# Patient Record
Sex: Male | Born: 1980 | Race: White | Hispanic: No | Marital: Single | Smoking: Current every day smoker
Health system: Southern US, Community
[De-identification: ages and names within clinical notes are randomized; demographics above are authoritative.]

## PROBLEM LIST (undated history)

## (undated) DIAGNOSIS — F329 Major depressive disorder, single episode, unspecified: Secondary | ICD-10-CM

## (undated) DIAGNOSIS — F1910 Other psychoactive substance abuse, uncomplicated: Secondary | ICD-10-CM

## (undated) DIAGNOSIS — F32A Depression, unspecified: Secondary | ICD-10-CM

## (undated) HISTORY — PX: APPENDECTOMY: SHX54

---
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
## 2005-06-22 ENCOUNTER — Emergency Department (HOSPITAL_COMMUNITY): Admission: EM | Admit: 2005-06-22 | Discharge: 2005-06-22 | Payer: Self-pay | Admitting: Family Medicine

## 2006-01-26 ENCOUNTER — Emergency Department (HOSPITAL_COMMUNITY): Admission: EM | Admit: 2006-01-26 | Discharge: 2006-01-26 | Payer: Self-pay | Admitting: *Deleted

## 2007-01-01 ENCOUNTER — Emergency Department (HOSPITAL_COMMUNITY): Admission: EM | Admit: 2007-01-01 | Discharge: 2007-01-01 | Payer: Self-pay | Admitting: Family Medicine

## 2007-04-21 ENCOUNTER — Emergency Department (HOSPITAL_COMMUNITY): Admission: EM | Admit: 2007-04-21 | Discharge: 2007-04-21 | Payer: Self-pay | Admitting: Emergency Medicine

## 2007-05-04 ENCOUNTER — Ambulatory Visit: Payer: Self-pay | Admitting: Internal Medicine

## 2007-05-05 ENCOUNTER — Ambulatory Visit (HOSPITAL_COMMUNITY): Admission: RE | Admit: 2007-05-05 | Discharge: 2007-05-05 | Payer: Self-pay | Admitting: Internal Medicine

## 2007-06-16 ENCOUNTER — Ambulatory Visit (HOSPITAL_BASED_OUTPATIENT_CLINIC_OR_DEPARTMENT_OTHER): Admission: RE | Admit: 2007-06-16 | Discharge: 2007-06-16 | Payer: Self-pay | Admitting: General Surgery

## 2007-06-16 ENCOUNTER — Encounter (INDEPENDENT_AMBULATORY_CARE_PROVIDER_SITE_OTHER): Payer: Self-pay | Admitting: General Surgery

## 2007-10-04 ENCOUNTER — Emergency Department (HOSPITAL_COMMUNITY): Admission: EM | Admit: 2007-10-04 | Discharge: 2007-10-04 | Payer: Self-pay | Admitting: Family Medicine

## 2009-03-11 ENCOUNTER — Emergency Department (HOSPITAL_COMMUNITY): Admission: EM | Admit: 2009-03-11 | Discharge: 2009-03-11 | Payer: Self-pay | Admitting: Emergency Medicine

## 2009-04-03 IMAGING — CT CT PELVIS W/ CM
3 series · 17 of 46 positions shown, 20 images · IV contrast (omnipaque)
Comparison: none

CLINICAL DATA: Pain and palpable abnormality in the left scrotal and inguinal regions.
 PELVIS CT WITH CONTRAST:
TECHNIQUE: Multidetector CT imaging of the pelvis was performed following the standard protocol during bolus administration of intravenous contrast.
 Contrast:  100 cc Omnipaque 300

[Series 2: rtn pelvis 5.0 b40f · axial · 0.70mm/px · z∈[-322,-68]mm · 13 of 57 slices shown, 16 images]
[im 4/57  soft-tissue]
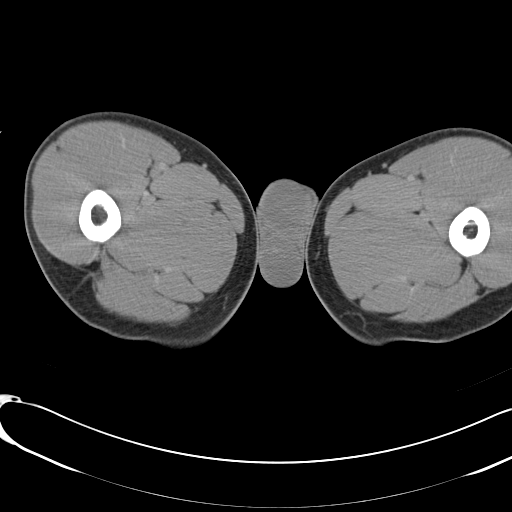
[im 4/57  bone]
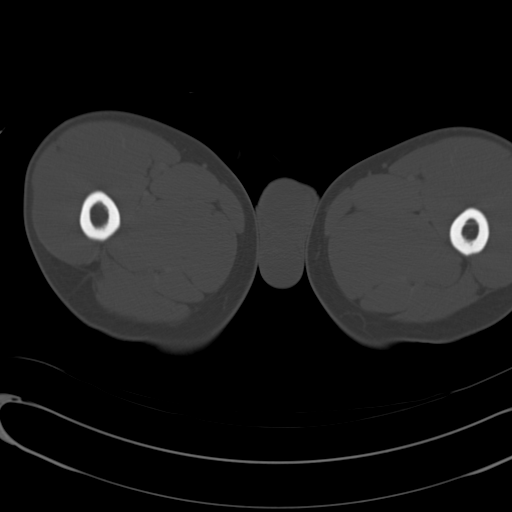
[im 10/57  soft-tissue]
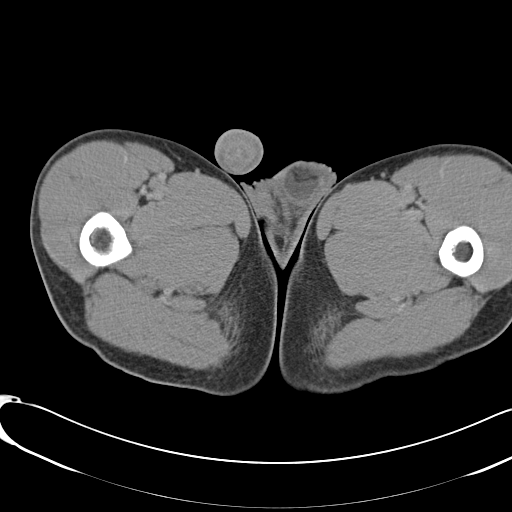
[im 15/57  soft-tissue]
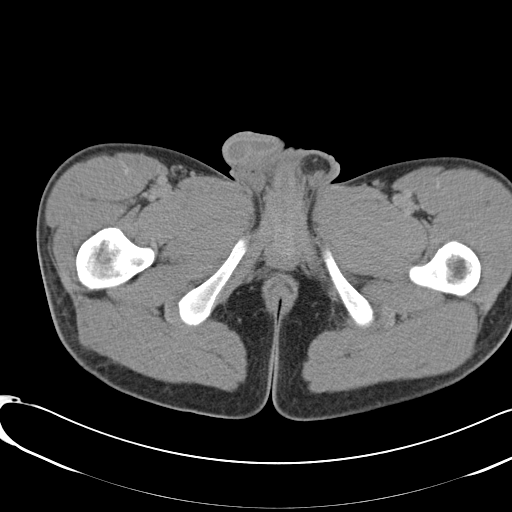
[im 20/57  soft-tissue]
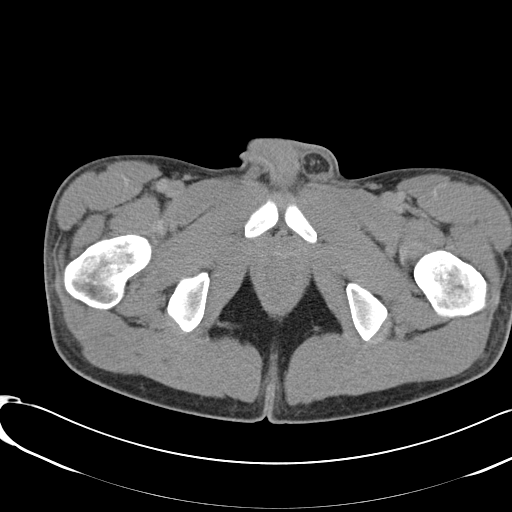
[im 26/57  soft-tissue]
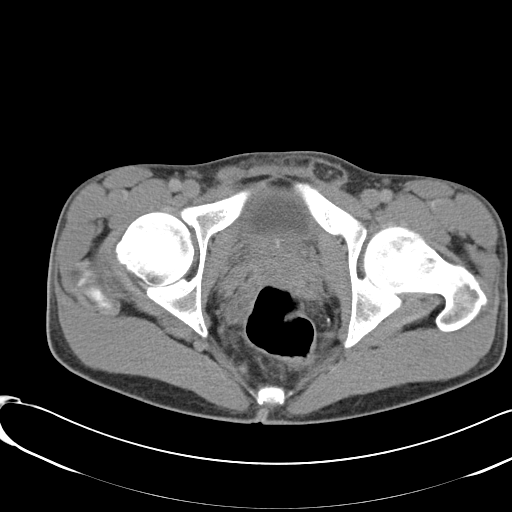
[im 31/57  soft-tissue]
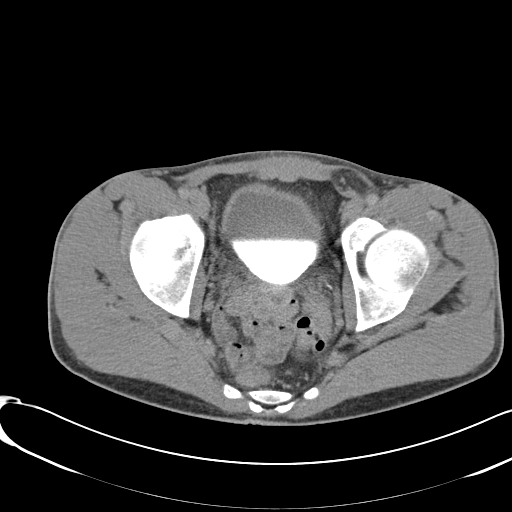
[im 37/57  soft-tissue]
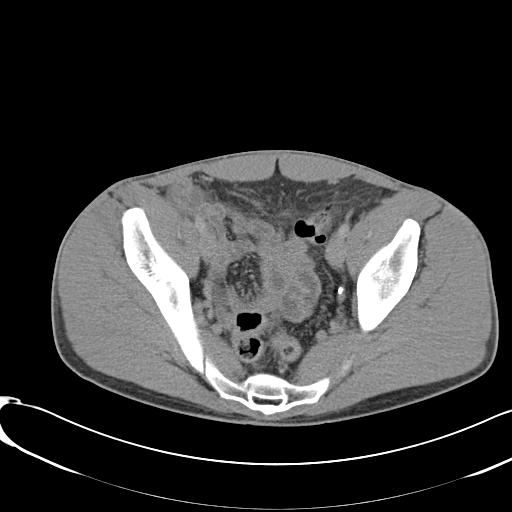
[im 42/57  soft-tissue]
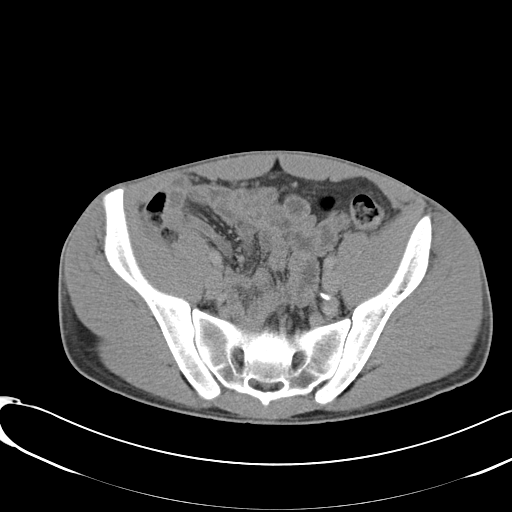
[im 47/57  soft-tissue]
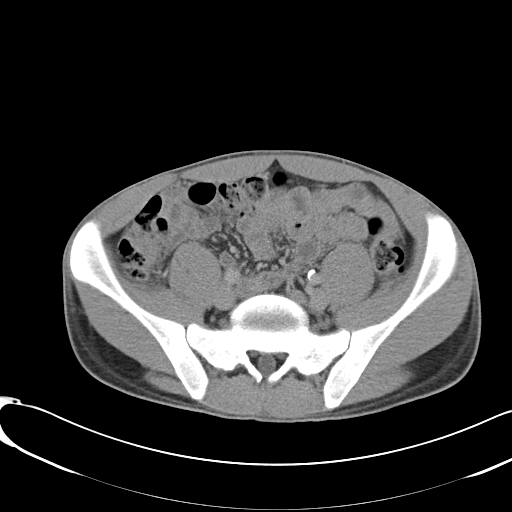
[im 47/57  bone]
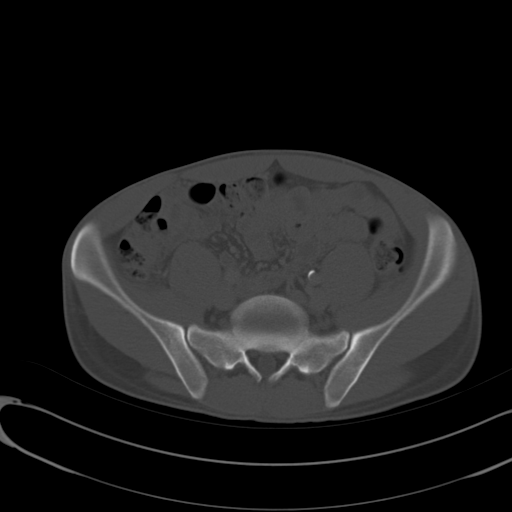
[im 49/57  lung]
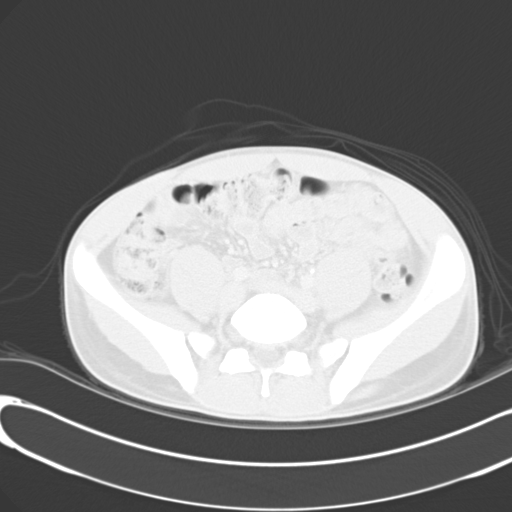
[im 51/57  lung]
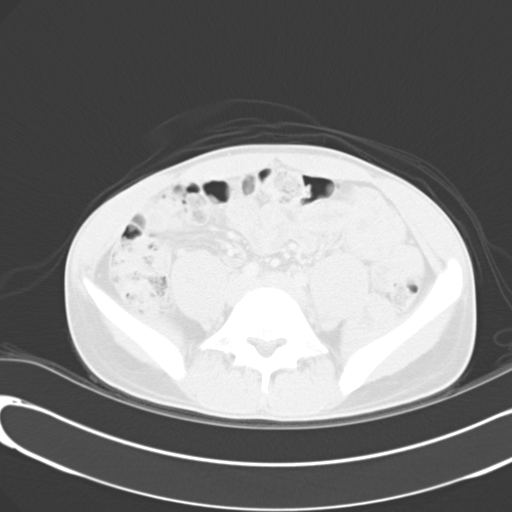
[im 53/57  soft-tissue]
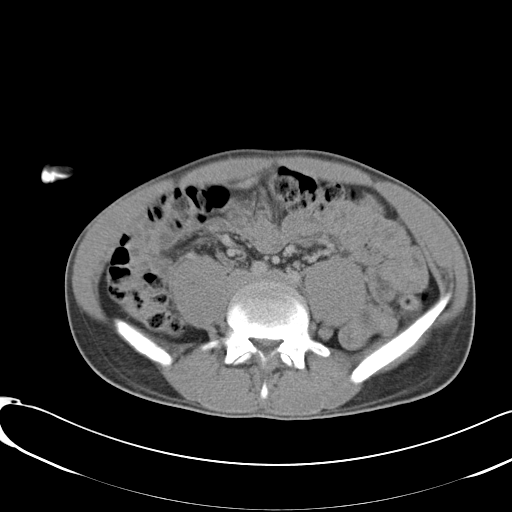
[im 53/57  lung]
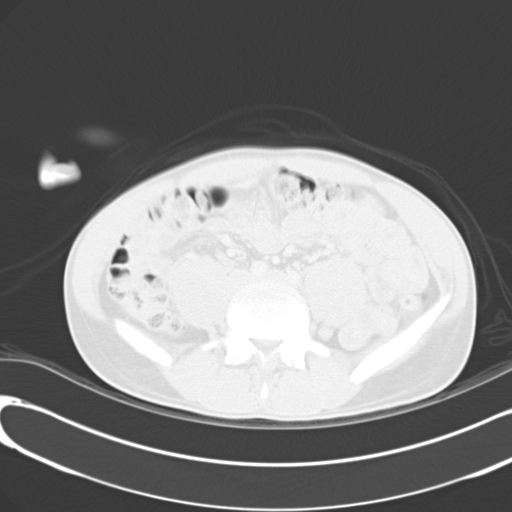
[im 55/57  lung]
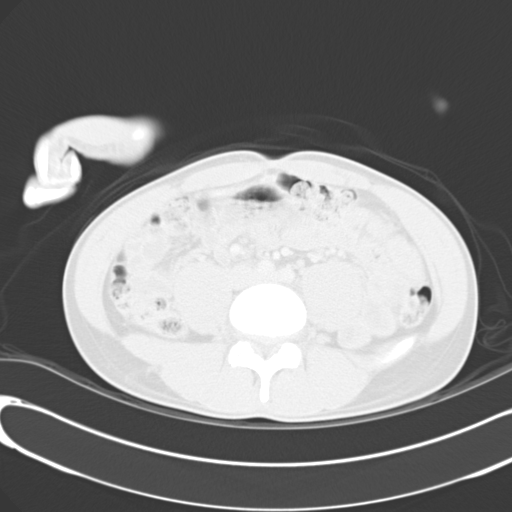

[Series 603: <mpr thick range> · coronal · 0.70mm/px · 3 of 67 slices shown]
[im 23/67  soft-tissue]
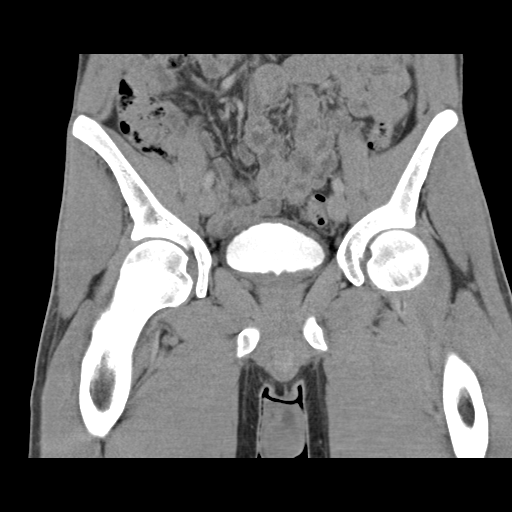
[im 30/67  soft-tissue]
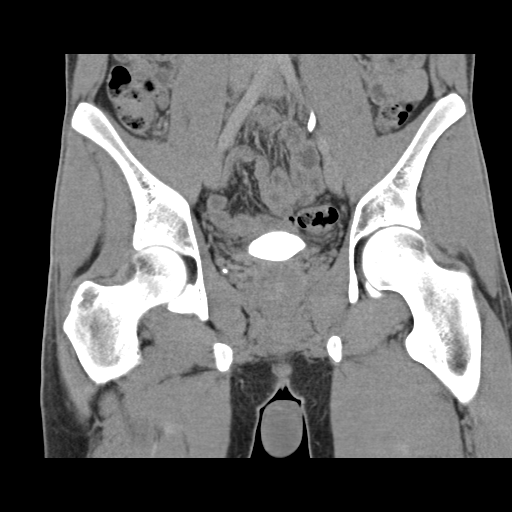
[im 37/67  soft-tissue]
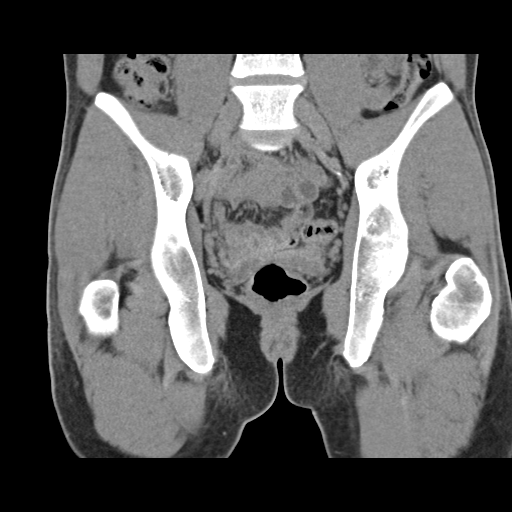

[Series 605: <mpr thick range(1)> · sagittal · 0.70mm/px · 1 of 104 slices shown]
[im 35/104  soft-tissue]
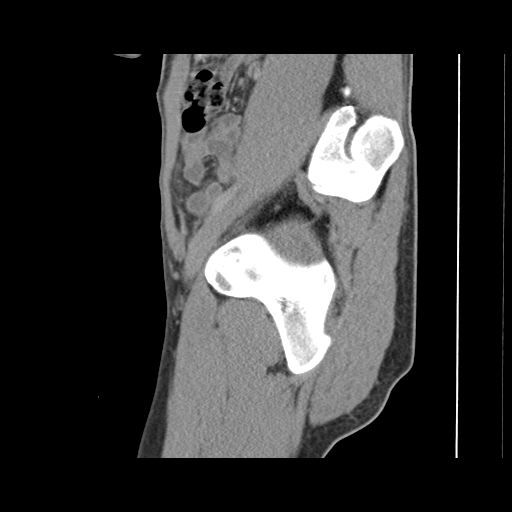

[17 of 46 positions shown; findings below may reference images not displayed]

FINDINGS: A fat attenuation structure is seen in the left inguinal canal coursing into the scrotum which is consistent with a hernia.  There is no evidence of herniated bowel loops.  
 No adenopathy or other soft tissue masses are seen within the pelvis.  There is no evidence of a pelvic inflammatory process or abnormal fluid collections.  The unopacified bowel loops are unremarkable in appearance.
IMPRESSION: Small left inguinal hernia containing only fat.  No evidence of herniated bowel.

## 2009-09-28 ENCOUNTER — Emergency Department (HOSPITAL_COMMUNITY): Admission: EM | Admit: 2009-09-28 | Discharge: 2009-09-28 | Payer: Self-pay | Admitting: Emergency Medicine

## 2009-09-28 ENCOUNTER — Emergency Department (HOSPITAL_COMMUNITY): Admission: EM | Admit: 2009-09-28 | Discharge: 2009-09-28 | Payer: Self-pay | Admitting: Family Medicine

## 2010-12-07 ENCOUNTER — Encounter: Payer: Self-pay | Admitting: Internal Medicine

## 2011-02-18 LAB — CBC
Hemoglobin: 17 g/dL (ref 13.0–17.0)
Platelets: 175 10*3/uL (ref 150–400)
RDW: 13.7 % (ref 11.5–15.5)

## 2011-02-18 LAB — POCT RAPID STREP A (OFFICE): Streptococcus, Group A Screen (Direct): NEGATIVE

## 2011-02-18 LAB — DIFFERENTIAL
Basophils Absolute: 0 10*3/uL (ref 0.0–0.1)
Lymphocytes Relative: 15 % (ref 12–46)
Monocytes Absolute: 1.5 10*3/uL — ABNORMAL HIGH (ref 0.1–1.0)
Neutro Abs: 11.4 10*3/uL — ABNORMAL HIGH (ref 1.7–7.7)
Neutrophils Relative %: 74 % (ref 43–77)

## 2011-03-31 NOTE — Op Note (Signed)
Casey Mcguire, Casey Mcguire               ACCOUNT NO.:  [id number]   MEDICAL RECORD NO.:  [id number]          PATIENT TYPE:  AMB   LOCATION:  [facility]                         FACILITY:  [facility]   PHYSICIAN:  Timothy E. Earlene Plater, M.D. DATE OF BIRTH:  1981/07/01   DATE OF PROCEDURE:  06/16/2007  DATE OF DISCHARGE:                               OPERATIVE REPORT   PREOPERATIVE DIAGNOSIS:  Left inguinal hernia.   POSTOPERATIVE DIAGNOSIS:  Left indirect inguinal hernia.   OPERATIVE PROCEDURE:  Repair of hernia.   SURGEON:  Timothy E. Earlene Plater, M.D.   ANESTHESIA:  General.   Mr. Leh is a thin healthy adult male without medical conditions who  has an enlarging painful left inguinal hernia and he wishes to have it  repaired after a careful explanation given.  He is seen, identified, and  the left groin marked.   He was taken to the operating room and placed supine.  General  endotracheal anesthesia was administered.  The left groin was shaved,  prepped, and draped in the usual fashion.  Marcaine 0.5% with  epinephrine was used prior to the incision.  A sliding horizontal  incision was made over the palpable defect, the scant subcutaneous  tissue dissected, the external oblique dissected free and opened in line  with its fibers to the external ring.  The ilioinguinal nerve lay atop  the cord structures and was left intact.  The cord structures were  dissected from the floor of the canal, they were enlarged.  Exploration  of the cord structures on the upper medial aspect of the cord at the  internal ring revealed a long and complete indirect inguinal hernia sac.  This was gently dissected from the cord structures which were,  otherwise, normal down to the testicle where it was divided just above  the testicle.  Cord structures and vas deferens were, of course, intact.  The cord was dissected back to the internal ring where it was ligated  with a 2-0 Prolene suture and the excess sac cut away and  submitted to  pathology.  The neck of the sac was retracted through the internal ring.  I decided since the entire groin was, otherwise, negative and completely  normal with good support, that I would not placed mesh in this patient.  Therefore, closure was accomplished by replacing the cord in the  anatomic position, closure of the external oblique with 2-0 Vicryl, deep  subcu 2-0 Vicryl, skin 3-0 Monocryl, and then Steri-Strips.  Final  counts were correct.  He tolerated it well and was removed to the  recovery room in good condition.   Written and verbal instructions were given to him and his girlfriend  along with Percocet, #36, and he will be followed as an outpatient.      Timothy E. Earlene Plater, M.D.  Electronically Signed    TED/MEDQ  D:  06/16/2007  T:  06/16/2007  Job:  [id number]

## 2011-04-03 NOTE — Discharge Summary (Signed)
[facility]  Patient:    Casey Mcguire, Casey Mcguire Visit Number: [id number] MRN: [id number]          Service Type: MED Location: 3W 0370 01 Attending Physician:  Carson Myrtle Dictated by:   Sheppard Plumber Earlene Plater, M.D. Admit Date:  11/07/2001 Discharge Date: 11/14/2001                             Discharge Summary  FINAL DIAGNOSIS:  Ruptured appendicitis with peritonitis.  HOSPITAL COURSE:  The patient was seen and admitted while on call in the emergency room with signs and symptoms of appendicitis.  He also had a CT scan ordered by Dr. Dorothe Pea that was reviewed and consistent with the physical and history.  He and his family were advised about appendectomy, and he underwent an open appendectomy on December 23 as an emergency.  His postoperative course was in general smooth.  It was a bit prolonged.  There was a delay in return to bowel function, i.e., a postop ileus, and some low-grade fever, but he did not show any signs of intercurrent infections.  The laboratory data were monitored.  He was closely followed.  As well, he was kept on antibiotics. Bowel function did return slowly.  His emotional status and attitude were not helpful.  This young man is in a drug rehab program in its early stages. In any case, bowel function was thought adequate by my covering partners on December 29 and December 30.  The wound was clean, the drain had been removed, and the Unasyn was stopped on the day prior to discharge.  He was given instructions as well as Vicodin and to be followed as an outpatient. Dictated by:   Sheppard Plumber Earlene Plater, M.D. Attending Physician:  Carson Myrtle DD:  12/08/01 TD:  12/09/01 Job: [id number] [id number]

## 2011-04-03 NOTE — Op Note (Signed)
[facility]  Patient:    Casey Mcguire, Casey Mcguire Visit Number: [id number] MRN: [id number]          Service Type: MED Location: 3W 0370 01 Attending Physician:  Carson Myrtle Dictated by:   Sheppard Plumber Earlene Plater, M.D. Proc. Date: 11/07/01 Admit Date:  11/07/2001   CC:         Lorenda Hatchet, M.D.   Operative Report  PREOPERATIVE DIAGNOSIS:  Acute appendicitis, probable rupture.  POSTOPERATIVE DIAGNOSIS:  Acute appendicitis with rupture and localized peritonitis.  OPERATIVE PROCEDURE:  Appendectomy.  SURGEON:  Timothy E. Earlene Plater, M.D.  ANESTHESIA:  General supervised by Dr. Rica Mast.  INDICATION:  This is an 30 year old Caucasian male who presents tonight with a five-day illness and an elevated white count and CT scan positive for appendicitis with probable abscess. His history is compatible. It is notable that he is in a drug rehab program, now 10 days off of marijuana and alcohol. His mother is in attendance. It is felt that he has appendicitis. The procedure, the expected outcome, and possible complications are carefully explained. He is ready to proceed.  He was treated with IV fluid resuscitation and IV Unasyn. He was evaluated by Dr. Rica Mast.  DESCRIPTION OF PROCEDURE:  He was taken to taken to the operating room, placed supine, and general endotracheal anesthesia was administered. Nasogastric tube and Foley catheter were inserted. The abdomen was prepped and draped in the usual fashion. A right lower quadrant McBurneys-type incision was made with the muscle-splitting approach and peritoneum was entered. A minimum of cloudy fluid was encountered and inflammatory mass was noted around the appendix. The appendix was in the usual location. It was ruptured at its mid point posteriorly. There was considerable inflammatory reaction but very little actual fluid accumulation. The appendix was carefully traced circuitously back to its base on the  appendix; its base was crushed and tied with a 2-0 chromic. The appendix was cut away and delivered off the field. The stump of the appendix was inverted beneath a chromic suture. Bleeding points were carefully identified, clamped and tied. A distinct mesoappendix was not present. The area was observed, carefully irrigated, and no further bleeding or problems were seen. Then, copious irrigation was carried out. A 19 round Blake catheter was placed and brought through an inferior stab wound and tied to the skin. It was place retrocecally. The cecum was replaced in its anatomic position. The counts were correct. The abdomen was closed in layers with 0 or #1 PDS suture. The skin was loosely closed with wide skin staples. Each layer was copiously irrigated. Final counts were correct. He tolerated it well and was removed to recovery room in good condition. Dictated by:   Sheppard Plumber Earlene Plater, M.D. Attending Physician:  Carson Myrtle DD:  11/07/01 TD:  11/09/01 Job: [phone] [id number]

## 2011-08-31 LAB — CBC
HCT: 46.3
Hemoglobin: 16.1
RBC: 5.05
WBC: 6.1

## 2011-08-31 LAB — DIFFERENTIAL
Eosinophils Relative: 1
Lymphocytes Relative: 38
Lymphs Abs: 2.3
Monocytes Absolute: 0.6
Neutro Abs: 3.1

## 2015-07-01 ENCOUNTER — Emergency Department (HOSPITAL_COMMUNITY): Payer: Self-pay

## 2015-07-01 ENCOUNTER — Inpatient Hospital Stay (HOSPITAL_COMMUNITY)
Admission: EM | Admit: 2015-07-01 | Discharge: 2015-07-02 | DRG: 917 | Discharge status: Against Medical Advice | Payer: Self-pay | Attending: Internal Medicine | Admitting: Internal Medicine

## 2015-07-01 ENCOUNTER — Encounter (HOSPITAL_COMMUNITY): Payer: Self-pay

## 2015-07-01 DIAGNOSIS — F1110 Opioid abuse, uncomplicated: Secondary | ICD-10-CM | POA: Diagnosis present

## 2015-07-01 DIAGNOSIS — F1290 Cannabis use, unspecified, uncomplicated: Secondary | ICD-10-CM | POA: Diagnosis present

## 2015-07-01 DIAGNOSIS — J9600 Acute respiratory failure, unspecified whether with hypoxia or hypercapnia: Secondary | ICD-10-CM | POA: Diagnosis present

## 2015-07-01 DIAGNOSIS — G40901 Epilepsy, unspecified, not intractable, with status epilepticus: Secondary | ICD-10-CM | POA: Insufficient documentation

## 2015-07-01 DIAGNOSIS — R001 Bradycardia, unspecified: Secondary | ICD-10-CM | POA: Diagnosis present

## 2015-07-01 DIAGNOSIS — G40301 Generalized idiopathic epilepsy and epileptic syndromes, not intractable, with status epilepticus: Secondary | ICD-10-CM

## 2015-07-01 DIAGNOSIS — G9340 Encephalopathy, unspecified: Secondary | ICD-10-CM

## 2015-07-01 DIAGNOSIS — F17210 Nicotine dependence, cigarettes, uncomplicated: Secondary | ICD-10-CM | POA: Diagnosis present

## 2015-07-01 DIAGNOSIS — J9601 Acute respiratory failure with hypoxia: Secondary | ICD-10-CM

## 2015-07-01 DIAGNOSIS — R4182 Altered mental status, unspecified: Secondary | ICD-10-CM | POA: Diagnosis present

## 2015-07-01 DIAGNOSIS — R569 Unspecified convulsions: Secondary | ICD-10-CM | POA: Diagnosis present

## 2015-07-01 DIAGNOSIS — T401X1A Poisoning by heroin, accidental (unintentional), initial encounter: Principal | ICD-10-CM | POA: Diagnosis present

## 2015-07-01 HISTORY — DX: Other psychoactive substance abuse, uncomplicated: F19.10

## 2015-07-01 LAB — COMPREHENSIVE METABOLIC PANEL
ALT: 6 U/L — AB (ref 17–63)
AST: 20 U/L (ref 15–41)
Albumin: 3.7 g/dL (ref 3.5–5.0)
Alkaline Phosphatase: 42 U/L (ref 38–126)
Anion gap: 9 (ref 5–15)
BUN: 7 mg/dL (ref 6–20)
CHLORIDE: 104 mmol/L (ref 101–111)
CO2: 26 mmol/L (ref 22–32)
CREATININE: 0.75 mg/dL (ref 0.61–1.24)
Calcium: 9 mg/dL (ref 8.9–10.3)
GFR calc Af Amer: >60 mL/min (ref >60)
GLUCOSE: 115 mg/dL — AB (ref 65–99)
Potassium: 3.5 mmol/L (ref 3.5–5.1)
SODIUM: 139 mmol/L (ref 135–145)
Total Bilirubin: 0.5 mg/dL (ref 0.3–1.2)
Total Protein: 5.7 g/dL — ABNORMAL LOW (ref 6.5–8.1)

## 2015-07-01 LAB — APTT: aPTT: 29 seconds (ref 24–37)

## 2015-07-01 LAB — POCT I-STAT 3, ART BLOOD GAS (G3+)
Acid-Base Excess: 3 mmol/L — ABNORMAL HIGH (ref 0.0–2.0)
BICARBONATE: 26.3 meq/L — AB (ref 20.0–24.0)
O2 Saturation: 99 %
PO2 ART: 111 mmHg — AB (ref 80.0–100.0)
Patient temperature: 98.8
TCO2: 27 mmol/L (ref 0–100)
pCO2 arterial: 35.2 mmHg (ref 35.0–45.0)
pH, Arterial: 7.482 — ABNORMAL HIGH (ref 7.350–7.450)

## 2015-07-01 LAB — MRSA PCR SCREENING: MRSA by PCR: NEGATIVE

## 2015-07-01 LAB — CBC WITH DIFFERENTIAL/PLATELET
BASOS ABS: 0 10*3/uL (ref 0.0–0.1)
Basophils Relative: 0 % (ref 0–1)
EOS PCT: 1 % (ref 0–5)
Eosinophils Absolute: 0.1 10*3/uL (ref 0.0–0.7)
HCT: 41.1 % (ref 39.0–52.0)
Hemoglobin: 13.9 g/dL (ref 13.0–17.0)
LYMPHS PCT: 23 % (ref 12–46)
Lymphs Abs: 1.5 10*3/uL (ref 0.7–4.0)
MCH: 30.8 pg (ref 26.0–34.0)
MCHC: 33.8 g/dL (ref 30.0–36.0)
MCV: 91.1 fL (ref 78.0–100.0)
Monocytes Absolute: 0.5 10*3/uL (ref 0.1–1.0)
Monocytes Relative: 7 % (ref 3–12)
Neutro Abs: 4.7 10*3/uL (ref 1.7–7.7)
Neutrophils Relative %: 69 % (ref 43–77)
PLATELETS: 133 10*3/uL — AB (ref 150–400)
RBC: 4.51 MIL/uL (ref 4.22–5.81)
RDW: 13.1 % (ref 11.5–15.5)
WBC: 6.8 10*3/uL (ref 4.0–10.5)

## 2015-07-01 LAB — TROPONIN I
Troponin I: <0.03 ng/mL (ref <0.031)
Troponin I: <0.03 ng/mL (ref <0.031)
Troponin I: <0.03 ng/mL (ref <0.031)

## 2015-07-01 LAB — RAPID URINE DRUG SCREEN, HOSP PERFORMED
Amphetamines: NOT DETECTED
Barbiturates: NOT DETECTED
Benzodiazepines: POSITIVE — AB
COCAINE: NOT DETECTED
OPIATES: POSITIVE — AB
TETRAHYDROCANNABINOL: POSITIVE — AB

## 2015-07-01 LAB — BLOOD GAS, ARTERIAL
Acid-Base Excess: 1.1 mmol/L (ref 0.0–2.0)
BICARBONATE: 25.1 meq/L — AB (ref 20.0–24.0)
Drawn by: 44138
FIO2: 100
LHR: 16 {breaths}/min
O2 Saturation: 99.9 %
PEEP: 5 cmH2O
PO2 ART: 424 mmHg — AB (ref 80.0–100.0)
Patient temperature: 94.6
TCO2: 26.3 mmol/L (ref 0–100)
VT: 580 mL
pCO2 arterial: 34.9 mmHg — ABNORMAL LOW (ref 35.0–45.0)
pH, Arterial: 7.459 — ABNORMAL HIGH (ref 7.350–7.450)

## 2015-07-01 LAB — PHOSPHORUS: Phosphorus: 3.8 mg/dL (ref 2.5–4.6)

## 2015-07-01 LAB — SALICYLATE LEVEL

## 2015-07-01 LAB — URINALYSIS, ROUTINE W REFLEX MICROSCOPIC
BILIRUBIN URINE: NEGATIVE
Glucose, UA: NEGATIVE mg/dL
Hgb urine dipstick: NEGATIVE
Ketones, ur: NEGATIVE mg/dL
Leukocytes, UA: NEGATIVE
NITRITE: NEGATIVE
PROTEIN: NEGATIVE mg/dL
SPECIFIC GRAVITY, URINE: 1.014 (ref 1.005–1.030)
UROBILINOGEN UA: 0.2 mg/dL (ref 0.0–1.0)
pH: 6.5 (ref 5.0–8.0)

## 2015-07-01 LAB — ACETAMINOPHEN LEVEL

## 2015-07-01 LAB — GLUCOSE, CAPILLARY: Glucose-Capillary: 86 mg/dL (ref 65–99)

## 2015-07-01 LAB — CBG MONITORING, ED: Glucose-Capillary: 102 mg/dL — ABNORMAL HIGH (ref 65–99)

## 2015-07-01 LAB — I-STAT CG4 LACTIC ACID, ED: Lactic Acid, Venous: 2 mmol/L (ref 0.5–2.0)

## 2015-07-01 LAB — TRIGLYCERIDES: Triglycerides: 77 mg/dL (ref <150)

## 2015-07-01 LAB — PROTIME-INR
INR: 1.11 (ref 0.00–1.49)
Prothrombin Time: 14.5 seconds (ref 11.6–15.2)

## 2015-07-01 LAB — ETHANOL

## 2015-07-01 LAB — MAGNESIUM: Magnesium: 1.9 mg/dL (ref 1.7–2.4)

## 2015-07-01 MED ORDER — PROPOFOL 1000 MG/100ML IV EMUL
5.0000 ug/kg/min | Freq: Once | INTRAVENOUS | Status: AC
  Administered 2015-07-01 (×2): 20 ug/kg/min via INTRAVENOUS
  Filled 2015-07-01: qty 100

## 2015-07-01 MED ORDER — PANTOPRAZOLE SODIUM 40 MG IV SOLR
40.0000 mg | Freq: Every day | INTRAVENOUS | Status: DC
  Administered 2015-07-01: 40 mg via INTRAVENOUS
  Filled 2015-07-01 (×2): qty 40

## 2015-07-01 MED ORDER — PROPOFOL 10 MG/ML IV BOLUS
INTRAVENOUS | Status: AC
  Filled 2015-07-01: qty 20

## 2015-07-01 MED ORDER — SUCCINYLCHOLINE CHLORIDE 20 MG/ML IJ SOLN
INTRAMUSCULAR | Status: AC
  Filled 2015-07-01: qty 1

## 2015-07-01 MED ORDER — SODIUM CHLORIDE 0.9 % IV BOLUS (SEPSIS)
1000.0000 mL | Freq: Once | INTRAVENOUS | Status: AC
  Administered 2015-07-01: 1000 mL via INTRAVENOUS

## 2015-07-01 MED ORDER — LIDOCAINE HCL (CARDIAC) 20 MG/ML IV SOLN
INTRAVENOUS | Status: AC
  Filled 2015-07-01: qty 5

## 2015-07-01 MED ORDER — DEXMEDETOMIDINE HCL IN NACL 200 MCG/50ML IV SOLN
0.4000 ug/kg/h | INTRAVENOUS | Status: DC
  Administered 2015-07-01: 0.8 ug/kg/h via INTRAVENOUS
  Filled 2015-07-01: qty 50

## 2015-07-01 MED ORDER — ANTISEPTIC ORAL RINSE SOLUTION (CORINZ)
7.0000 mL | Freq: Four times a day (QID) | OROMUCOSAL | Status: DC
  Administered 2015-07-01 – 2015-07-02 (×3): 7 mL via OROMUCOSAL

## 2015-07-01 MED ORDER — PROPOFOL 1000 MG/100ML IV EMUL
0.0000 ug/kg/min | INTRAVENOUS | Status: DC
  Administered 2015-07-01 – 2015-07-02 (×4): 40 ug/kg/min via INTRAVENOUS
  Filled 2015-07-01 (×5): qty 100

## 2015-07-01 MED ORDER — LORAZEPAM 2 MG/ML IJ SOLN
1.0000 mg | Freq: Once | INTRAMUSCULAR | Status: AC
  Administered 2015-07-01: 1 mg via INTRAVENOUS

## 2015-07-01 MED ORDER — FENTANYL CITRATE (PF) 100 MCG/2ML IJ SOLN
100.0000 ug | INTRAMUSCULAR | Status: DC | PRN
  Administered 2015-07-02: 100 ug via INTRAVENOUS
  Filled 2015-07-01: qty 2

## 2015-07-01 MED ORDER — ROCURONIUM BROMIDE 50 MG/5ML IV SOLN
INTRAVENOUS | Status: AC
Start: 2015-07-01 — End: 2015-07-01
  Filled 2015-07-01: qty 2

## 2015-07-01 MED ORDER — PROPOFOL 1000 MG/100ML IV EMUL: 0.0000 ug/kg/min | INTRAVENOUS | Status: DC

## 2015-07-01 MED ORDER — ROCURONIUM BROMIDE 50 MG/5ML IV SOLN
1.0000 mg/kg | Freq: Once | INTRAVENOUS | Status: AC
  Administered 2015-07-01: 75 mg via INTRAVENOUS

## 2015-07-01 MED ORDER — FENTANYL CITRATE (PF) 100 MCG/2ML IJ SOLN: 100.0000 ug | INTRAMUSCULAR | Status: DC | PRN

## 2015-07-01 MED ORDER — LORAZEPAM 2 MG/ML IJ SOLN
INTRAMUSCULAR | Status: AC
  Filled 2015-07-01: qty 1

## 2015-07-01 MED ORDER — PROPOFOL 1000 MG/100ML IV EMUL
INTRAVENOUS | Status: AC
  Filled 2015-07-01: qty 100

## 2015-07-01 MED ORDER — PROPOFOL 10 MG/ML IV BOLUS
75.0000 mg | Freq: Once | INTRAVENOUS | Status: AC
  Administered 2015-07-01: 75 mg via INTRAVENOUS

## 2015-07-01 MED ORDER — ETOMIDATE 2 MG/ML IV SOLN
INTRAVENOUS | Status: AC
Start: 2015-07-01 — End: 2015-07-01
  Filled 2015-07-01: qty 20

## 2015-07-01 MED ORDER — CHLORHEXIDINE GLUCONATE 0.12% ORAL RINSE (MEDLINE KIT)
15.0000 mL | Freq: Two times a day (BID) | OROMUCOSAL | Status: DC
  Administered 2015-07-01 (×2): 15 mL via OROMUCOSAL

## 2015-07-01 MED ORDER — HEPARIN SODIUM (PORCINE) 5000 UNIT/ML IJ SOLN
5000.0000 [IU] | Freq: Three times a day (TID) | INTRAMUSCULAR | Status: DC
  Administered 2015-07-01 – 2015-07-02 (×4): 5000 [IU] via SUBCUTANEOUS
  Filled 2015-07-01 (×5): qty 1

## 2015-07-01 NOTE — ED Notes (Signed)
Per [facility], pt from home for seizures. Snorted heroin at 1700 today and PD arrived and saw him balled up into a ball and witness entire body shaking. EMS arrived and pt had decorticate posturing. Pt was intermittently posturing and then would ask what was going on. Started seizing after a few seconds and came out of it. Had several episodes of seconds of seizures. 18g to LAC. Given 1 mg Narcan. Given 2.5 mg versed. Continued to have seizures during route in and given another 2.5 mg versed.

## 2015-07-01 NOTE — ED Notes (Signed)
EEG att the bedside

## 2015-07-01 NOTE — ED Notes (Signed)
Per the fiancee, last seen normal was 1500 yesterday, came home at 1230 after midnight.

## 2015-07-01 NOTE — Progress Notes (Signed)
STAT EEG completed; results pending. 

## 2015-07-01 NOTE — ED Notes (Signed)
PCCM at the bedside 

## 2015-07-01 NOTE — Consult Note (Signed)
Reason for Consult:Seizures Referring Physician: Rhunette Croft  CC: Seizures  HPI: Casey Mcguire is an 34 y.o. male with no significant past medical history who is unable to give history today due to intubation and sedation.  Per fiancee, patient left home yesterday at about 1630 and did not return until after midnight.  He had been found by a friend "nodding".  When this had been noted in the past it was due to heroin use.  Patient was brought home.  Later noted to have what appears to be intermittent seizures.  Was intermittently somewhat appropriate.  PD arrived and patient was in a ball shaking all over.  When EMS arrived patient was intermittently decorticate posturing.  Patient was then noted to have intermittent episodes of seizure that continued while en route.  Narcan and Versed administered.  Patient now intubated and sedated.    Past medical history: None  Past surgical history: None  Family history: Mother and father alive and well without any medical problems.  Has multiple siblings, all alive, one with schizophrenia.    Social History:  reports that he uses illicit drugs (Marijuana). His tobacco and alcohol histories are not on file. Has recently started using heroin.  Allergies: No Known Allergies  Medications: None  ROS: Unable to obtain due to intubation  Physical Examination: Blood pressure 106/66, pulse 51, temperature 97.3 F (36.3 C), temperature source Rectal, resp. rate 12, height 5\' 10"  (1.778 m), weight 75 kg (165 lb 5.5 oz), SpO2 97 %.  HEENT-  Normocephalic, no lesions, without obvious abnormality.  Normal external eye and conjunctiva.  Normal TM's bilaterally.  Normal auditory canals and external ears. Normal external nose, mucus membranes and septum.  Normal pharynx. Cardiovascular- S1, S2 normal, pulses palpable throughout   Lungs- chest clear, no wheezing, rales, normal symmetric air entry Abdomen- soft, non-tender; bowel sounds normal; no masses,  no  organomegaly Extremities- no edema Lymph-no adenopathy palpable Musculoskeletal-no joint tenderness, deformity or swelling Skin-scratches on arms at elbow creases  Neurological Examination Mental Status: Patient does not respond to verbal stimuli.  Does not respond to deep sternal rub.  Does not follow commands.  No verbalizations noted.  Cranial Nerves: II: patient does not respond confrontation bilaterally, pupils right 4 mm, left 4 mm,and sluggishly reactive bilaterally III,IV,VI: doll's response absent bilaterally but patient becomes restless in bed with maneuver.   V,VII: corneal reflex absent bilaterally  VIII: patient does not respond to verbal stimuli IX,X: gag reflex absent, XI: trapezius strength unable to test bilaterally XII: tongue strength unable to test Motor: Patient appears to prefer a flexed position for all extremities.  No myoclonus or jerking activity noted.   Sensory: Does not respond to noxious stimuli in any extremity. Deep Tendon Reflexes:  2+ throughout. Plantars: downgoing bilaterally Cerebellar: Unable to perform    Laboratory Studies:   Basic Metabolic Panel:  Recent Labs Lab 07/01/15 0459  NA 139  K 3.5  CL 104  CO2 26  GLUCOSE 115*  BUN 7  CREATININE 0.75  CALCIUM 9.0  MG 1.9    Liver Function Tests:  Recent Labs Lab 07/01/15 0459  AST 20  ALT 6*  ALKPHOS 42  BILITOT 0.5  PROT 5.7*  ALBUMIN 3.7   No results for input(s): LIPASE, AMYLASE in the last 168 hours. No results for input(s): AMMONIA in the last 168 hours.  CBC:  Recent Labs Lab 07/01/15 0459  WBC 6.8  NEUTROABS 4.7  HGB 13.9  HCT 41.1  MCV 91.1  PLT 133*    Cardiac Enzymes:  Recent Labs Lab 07/01/15 0459  TROPONINI <0.03    BNP: Invalid input(s): POCBNP  CBG:  Recent Labs Lab 07/01/15 0447  GLUCAP 102*    Microbiology: No results found for this or any previous visit.  Coagulation Studies:  Recent Labs  07/01/15 0459  LABPROT  14.5  INR 1.11    Urinalysis:  Recent Labs Lab 07/01/15 0521  COLORURINE YELLOW  LABSPEC 1.014  PHURINE 6.5  GLUCOSEU NEGATIVE  HGBUR NEGATIVE  BILIRUBINUR NEGATIVE  KETONESUR NEGATIVE  PROTEINUR NEGATIVE  UROBILINOGEN 0.2  NITRITE NEGATIVE  LEUKOCYTESUR NEGATIVE    Lipid Panel:  No results found for: CHOL, TRIG, HDL, CHOLHDL, VLDL, LDLCALC  HgbA1C: No results found for: HGBA1C  Urine Drug Screen:     Component Value Date/Time   LABOPIA POSITIVE* 07/01/2015 0521   COCAINSCRNUR NONE DETECTED 07/01/2015 0521   LABBENZ POSITIVE* 07/01/2015 0521   AMPHETMU NONE DETECTED 07/01/2015 0521   THCU POSITIVE* 07/01/2015 0521   LABBARB NONE DETECTED 07/01/2015 0521    Alcohol Level:  Recent Labs Lab 07/01/15 0459  ETH <5    Other results: EKG: sinus rhythm at 82 bpm.  Imaging: Ct Head Wo Contrast  07/01/2015   CLINICAL DATA:  Heroin overdose, multiple seizures, post intubation.  EXAM: CT HEAD WITHOUT CONTRAST  TECHNIQUE: Contiguous axial images were obtained from the base of the skull through the vertex without intravenous contrast.  COMPARISON:  None.  FINDINGS: The ventricles and sulci are normal. No intraparenchymal hemorrhage, mass effect nor midline shift. No acute large vascular territory infarcts.  No abnormal extra-axial fluid collections. Basal cisterns are patent.  No skull fracture. The included ocular globes and orbital contents are non-suspicious. The mastoid aircells and included paranasal sinuses are well-aerated. Life support lines in place.  IMPRESSION: Normal noncontrast CT head.   Electronically Signed   By: Awilda Metro M.D.   On: 07/01/2015 06:03   Dg Chest Portable 1 View  07/01/2015   CLINICAL DATA:  Intubation.  Seizures.  EXAM: PORTABLE CHEST - 1 VIEW  COMPARISON:  None.  FINDINGS: Endotracheal tube with tip at the clavicular heads. The orogastric tube reaches the stomach at least.  No evidence of aspiration or edema. No effusion or pneumothorax.  Normal heart size and mediastinal contours.  IMPRESSION: 1. Endotracheal and orogastric tubes are in good position. 2. No evidence acute cardiopulmonary disease.   Electronically Signed   By: Marnee Spring M.D.   On: 07/01/2015 05:50     Assessment/Plan: 34 year old male presenting with intermittent seizure activity.  Now intubated and sedated.  Patient with no history of seizures and on no medications.  Serum magnesium normal.  Likely related to heroin overdose.  No clinical seizure activity noted at this time.  Head CT independently reviewed and shows no acute changes.    Recommendations: 1.  Ativan prn 2.  Seizure precautions 3.  EEG stat.  This will direct anticonvulsant therapy.  Will not initiate at this time.   4.  MRI of the brain without contrast when patient extubated.     Thana Farr, MD [facility] [phone] 07/01/2015, 8:01 AM

## 2015-07-01 NOTE — Progress Notes (Signed)
Patient transported to CT on ventilator with no complications. 

## 2015-07-01 NOTE — ED Notes (Addendum)
Bilateral mittens applied to hands. Not tied down. Mother at bedside. Explained if patient continued to try to pull at the tube we would have to try something different.  Mother verbalized understanding.

## 2015-07-01 NOTE — H&P (Signed)
PULMONARY / CRITICAL CARE MEDICINE   Name: Casey Mcguire MRN: [id number] DOB: 01/08/1981    ADMISSION DATE:  07/01/2015  REFERRING MD :  EDP  CHIEF COMPLAINT:  AMS   INITIAL PRESENTATION: 33yo male smoker with hx drug abuse presented 8/15 after being found with AMS and ?seizures after likely heroin use.  Intubated on arrival to ER.  PCCM called to admit.   STUDIES:  CT head 8/15>>> neg acute  EEG 8/15>>> UDS 8/15>> POS opiates, benzos, THC  SIGNIFICANT EVENTS:   HISTORY OF PRESENT ILLNESS:  33yo male smoker with hx drug abuse presented 8/15 after being found with AMS and ?seizures after likely heroin use.  Per pt's fiance he left home to get gas around 1630 on 8/14 and did not return home until after midnight when he was found by a friend "nodding".  He was intermittently appropriate and brought home where fiance later found him "curled in a ball" on the kitchen floor having spilled the milkshake he was attempting to make. Some intermittent seizure activity described by fiancee and EMS but none further in ER.  Intubated and started on propofol.  Seen in consultation by neurology.  PCCM called to admit.    Per fiance he snorted the heroin.  Has never used IV drugs.    PAST MEDICAL HISTORY :   has a past medical history of Drug abuse.  has no past surgical history on file. Prior to Admission medications   Not on File   No Known Allergies  FAMILY HISTORY:  has no family status information on file.  SOCIAL HISTORY:  reports that he uses illicit drugs (Marijuana).  REVIEW OF SYSTEMS:  Unable.  As per HPI obtained from fiance.   SUBJECTIVE:   VITAL SIGNS: Temp:  [91.4 F (33 C)-99.9 F (37.7 C)] 99.9 F (37.7 C) (08/15 0900) Pulse Rate:  [45-90] 67 (08/15 0900) Resp:  [12-19] 12 (08/15 0900) BP: (106-135)/(54-94) 110/68 mmHg (08/15 0900) SpO2:  [95 %-100 %] 95 % (08/15 0900) FiO2 (%):  [30 %-100 %] 30 % (08/15 0720) Weight:  [165 lb 5.5 oz (75 kg)] 165 lb 5.5 oz (75 kg)  (08/15 0447) HEMODYNAMICS:   VENTILATOR SETTINGS: Vent Mode:  [-] PRVC FiO2 (%):  [30 %-100 %] 30 % Set Rate:  [12 bmp-16 bmp] 12 bmp Vt Set:  [580 mL] 580 mL PEEP:  [5 cmH20] 5 cmH20 Plateau Pressure:  [13 cmH20-15 cmH20] 15 cmH20 INTAKE / OUTPUT:  Intake/Output Summary (Last 24 hours) at 07/01/15 0927 Last data filed at 07/01/15 0627  Gross per 24 hour  Intake   1000 ml  Output    300 ml  Net    700 ml    PHYSICAL EXAMINATION: General:  Young, wdwn male, NAD on vent  Neuro:  Sedated on propofol, RASS -3, Woke up very agitated, pulling at tubes, kicking when propofol turned off for WUA, no further seizure activity noted, pupils 28m95[place]6mOdis LT>9>2546m.95[place]63Odis LT>9>2532m.95[place]63Odis LT>9>2557m.95[place]63Odis LT>9>2557m.95[place]63Odis LT>9>255m.95[place]63Odis LT>9>25105m.95[place]63Odis LT>9>2532m.95[place]63Odis LT>9>2535m.95[place]63Odis LT>9>2546m.95[place]63Odis LT>9>253m.95[place]63Odis LT>9>2532m.95[place]63Odis LT>9>2586m.95[place]63Odis LT>9>2581m.95[place]63Odis LT>9>2550m.95[place]63Odis LT>9>2544m.95[place]63Odis LT>9>2525m.95[place]63Odis LT>9>2552m.95[place]63Odis LT>9>2533m.95[place]63Odis LT>9>2548m.95[place]63Odis LT>9>2572m.95[place]63Odis LT>9>2540m.95[place]63Odis LT>9>2559m.95[place]63Odis LT>9>2562m.95[place]63Odis LT>9>2580m.95[place]63Odis LT>9>2524m.95[place]63Odis LT>9>251m.95[place]63Odis LT>9>2573m.95[place]63Odis LT>9>2539m.95[place]63Odis LT>9>2588m.95[place]63Odis LT>9>25.36ny Bollman HEENT:  Mm moist, ETT  Cardiovascular:  s1s2 rrr Lungs:  resps even non labored on vent, clear  Abdomen:  Soft, +bs  Musculoskeletal:  Warm and dry, no edema   LABS:  CBC  Recent Labs Lab 07/01/15 0459  WBC 6.8  HGB 13.9  HCT 41.1  PLT 133*   Coag's  Recent Labs Lab 07/01/15 0459  APTT 29  INR 1.11   BMET  Recent Labs Lab 07/01/15 0459  NA 139  K 3.5  CL 104  CO2 26  BUN  7  CREATININE 0.75  GLUCOSE 115*   Electrolytes  Recent Labs Lab 07/01/15 0459  CALCIUM 9.0  MG 1.9   Sepsis Markers  Recent Labs Lab 07/01/15 0504  LATICACIDVEN 2.00   ABG  Recent Labs Lab 07/01/15 0531  PHART 7.459*  PCO2ART 34.9*  PO2ART 424*   Liver Enzymes  Recent Labs Lab 07/01/15 0459  AST 20  ALT 6*  ALKPHOS 42  BILITOT 0.5  ALBUMIN 3.7   Cardiac Enzymes  Recent Labs Lab 07/01/15 0459  TROPONINI <0.03   Glucose  Recent Labs Lab 07/01/15 0447  GLUCAP 102*    Imaging Ct Head Wo Contrast  07/01/2015   CLINICAL DATA:  Heroin overdose, multiple seizures, post intubation.  EXAM: CT HEAD WITHOUT CONTRAST  TECHNIQUE: Contiguous axial images were obtained from the base of the skull through the vertex without intravenous contrast.  COMPARISON:  None.  FINDINGS: The ventricles and sulci are normal. No  intraparenchymal hemorrhage, mass effect nor midline shift. No acute large vascular territory infarcts.  No abnormal extra-axial fluid collections. Basal cisterns are patent.  No skull fracture. The included ocular globes and orbital contents are non-suspicious. The mastoid aircells and included paranasal sinuses are well-aerated. Life support lines in place.  IMPRESSION: Normal noncontrast CT head.   Electronically Signed   By: Awilda Metro M.D.   On: 07/01/2015 06:03   Dg Chest Portable 1 View  07/01/2015   CLINICAL DATA:  Intubation.  Seizures.  EXAM: PORTABLE CHEST - 1 VIEW  COMPARISON:  None.  FINDINGS: Endotracheal tube with tip at the clavicular heads. The orogastric tube reaches the stomach at least.  No evidence of aspiration or edema. No effusion or pneumothorax. Normal heart size and mediastinal contours.  IMPRESSION: 1. Endotracheal and orogastric tubes are in good position. 2. No evidence acute cardiopulmonary disease.   Electronically Signed   By: Marnee Spring M.D.   On: 07/01/2015 05:50     ASSESSMENT / PLAN:  PULMONARY OETT 8/15>>> Acute respiratory failure - in setting drug OD +/- seizure  P:   Vent support - 8cc/kg  F/u CXR  F/u ABG Cont propofol and ETT for now - if no seizure activity on EEG consider wake up and extubate if purposeful   CARDIOVASCULAR Bradycardia - mild.  P:  Monitor  Troponin pending   RENAL No active issue  P:   F/u chem   GASTROINTESTINAL No active issue  P:   PPI  NPO   HEMATOLOGIC No active issue  P:  Heparin SQ  F/u CBC   INFECTIOUS No active issue  P:   Monitor wbc, fever curve off abx   ENDOCRINE No active issue  P:   Monitor glucose on chem   NEUROLOGIC AMS  Drug OD  ??Seizure - neuro following.  Doubt actual seizure.  CT head neg.  P:   RASS goal: -1 Continue propofol for now  PRN fentanyl  EEG pending  F/u MRI per neuro -- ??do this while intubated.    FAMILY  - Updates:  Pts mother and fiance  updated at bedside 8/15 in ER.      Dirk Dress, NP 07/01/2015  9:27 AM Pager: [phone] or [phone]

## 2015-07-01 NOTE — ED Notes (Signed)
Pt returned from CT scan with this RN

## 2015-07-01 NOTE — ED Notes (Signed)
Propofol restarted. Pt was awake and pulling at the ETT. Provider informed.

## 2015-07-01 NOTE — Procedures (Signed)
ELECTROENCEPHALOGRAM REPORT   Patient: Casey Mcguire      Room #:  Age: 34 y.o.        Sex: male Referring Physician: Dr Thad Ranger Report Date:  07/01/2015        Interpreting Physician: Omelia Blackwater  History: Casey Mcguire is an 34 y.o. male known substance abuse user admitted with AMS and seizure activity  Medications:  Continuous: . dexmedetomidine 0.8 mcg/kg/hr (07/01/15 1134)    Conditions of Recording:  This is a 16 channel EEG carried out with the patient in the intubated and sedated state.  Description:  The waking background activity consists of a low voltage, symmetrical, fairly well organized, 9-11 Hz alpha activity, seen from the parieto-occipital and posterior temporal regions. There are frequent runs of background slowing in the theta range and brief periods of background suppression.   Hyperventilation was not performed. Intermittent photic stimulation was not performed.   IMPRESSION: Normal electroencephalogram. Brief periods of background suppression likely related to medication effect. There are no focal lateralizing or epileptiform features.   Elspeth Cho, DO [facility] [phone]  If 7pm- 7am, please page neurology on call as listed in AMION. 07/01/2015, 11:30 AM

## 2015-07-01 NOTE — ED Notes (Signed)
Propofol paused at this time. Pt remains sedated.

## 2015-07-01 NOTE — ED Notes (Signed)
Neurology at the bedside

## 2015-07-01 NOTE — ED Notes (Signed)
Dr. Rhunette Croft at the bedside to speak with family

## 2015-07-01 NOTE — Progress Notes (Signed)
RT advanced ETT 2cm per MD order.

## 2015-07-02 ENCOUNTER — Inpatient Hospital Stay (HOSPITAL_COMMUNITY): Payer: Self-pay

## 2015-07-02 DIAGNOSIS — R40243 Glasgow coma scale score 3-8: Secondary | ICD-10-CM

## 2015-07-02 DIAGNOSIS — R569 Unspecified convulsions: Secondary | ICD-10-CM

## 2015-07-02 DIAGNOSIS — G40901 Epilepsy, unspecified, not intractable, with status epilepticus: Secondary | ICD-10-CM | POA: Insufficient documentation

## 2015-07-02 DIAGNOSIS — G9340 Encephalopathy, unspecified: Secondary | ICD-10-CM

## 2015-07-02 LAB — CBC
HEMATOCRIT: 40.7 % (ref 39.0–52.0)
HEMOGLOBIN: 13.8 g/dL (ref 13.0–17.0)
MCH: 31 pg (ref 26.0–34.0)
MCHC: 33.9 g/dL (ref 30.0–36.0)
MCV: 91.5 fL (ref 78.0–100.0)
Platelets: 140 10*3/uL — ABNORMAL LOW (ref 150–400)
RBC: 4.45 MIL/uL (ref 4.22–5.81)
RDW: 13.5 % (ref 11.5–15.5)
WBC: 10.5 10*3/uL (ref 4.0–10.5)

## 2015-07-02 LAB — BASIC METABOLIC PANEL
ANION GAP: 9 (ref 5–15)
BUN: 8 mg/dL (ref 6–20)
CHLORIDE: 109 mmol/L (ref 101–111)
CO2: 22 mmol/L (ref 22–32)
Calcium: 8.8 mg/dL — ABNORMAL LOW (ref 8.9–10.3)
Creatinine, Ser: 0.82 mg/dL (ref 0.61–1.24)
GFR calc Af Amer: >60 mL/min (ref >60)
GLUCOSE: 86 mg/dL (ref 65–99)
POTASSIUM: 3.7 mmol/L (ref 3.5–5.1)
Sodium: 140 mmol/L (ref 135–145)

## 2015-07-02 LAB — SODIUM, URINE, RANDOM: SODIUM UR: 63 mmol/L

## 2015-07-02 NOTE — Clinical Social Work Note (Signed)
CSW consult acknowledged:  Clinical Social Worker received consult for current substance abuse. Patient not interested in substance abuse resources and has left AMA.  Clinical Social Worker will sign off for now as social work intervention is no longer needed. Please consult [place] again if new need arises.  Derenda Fennel, MSW, LCSWA [phone] 07/02/2015 12:10 PM

## 2015-07-02 NOTE — Procedures (Signed)
Extubation Procedure Note  Patient Details:   Name: Casey Mcguire DOB: June 29, 1981 MRN: [id number]   Airway Documentation:  Airway 7.5 mm (Active)  Secured at (cm) 25 cm 07/02/2015  8:35 AM  Measured From Lips 07/02/2015  8:35 AM  Secured Location Left 07/02/2015  8:35 AM  Secured By [employer] 07/02/2015  8:35 AM  Tube Holder Repositioned Yes 07/02/2015  8:13 AM  Cuff Pressure (cm H2O) 22 cm H2O 07/01/2015  7:25 PM  Site Condition Dry 07/02/2015  8:35 AM    Evaluation  O2 sats: stable throughout Complications: No apparent complications Patient did tolerate procedure well. Bilateral Breath Sounds: Clear Suctioning: Oral, Airway Yes   Pt. Was extubated to a 3L Alpha with RN at the bedside without any complications, dyspnea or stridor noted.   Carlynn Spry 07/02/2015, 9:32 AM

## 2015-07-02 NOTE — Progress Notes (Signed)
Subjective: No further seizures noted.  Remains intubated and sedated.  Propofol decreased to .    Objective: Current vital signs: BP 130/75 mmHg  Pulse 54  Temp(Src) 98.9 F (37.2 C) (Oral)  Resp 19  Ht 5\' 10"  (1.778 m)  Wt 75 kg (165 lb 5.5 oz)  BMI 23.72 kg/m2  SpO2 100% Vital signs in last 24 hours: Temp:  [97.5 F (36.4 C)-98.9 F (37.2 C)] 98.9 F (37.2 C) (08/16 0400) Pulse Rate:  [43-98] 54 (08/16 0835) Resp:  [14-19] 19 (08/16 0835) BP: (89-130)/(55-75) 130/75 mmHg (08/16 0835) SpO2:  [94 %-100 %] 100 % (08/16 0835) FiO2 (%):  [30 %] 30 % (08/16 0835) Weight:  [75 kg (165 lb 5.5 oz)] 75 kg (165 lb 5.5 oz) (08/16 0453)  Intake/Output from previous day: 08/15 0701 - 08/16 0700 In: 670.4 [I.V.:670.4] Out: 2425 [Urine:2425] Intake/Output this shift: Total I/O In: 12.4 [I.V.:12.4] Out: 50 [Urine:50] Nutritional status: Diet NPO time specified  Neurologic Exam: Mental Status: Patient does not respond to verbal stimuli. Localizes to pain with deep sternal rub. Does not follow commands. No verbalizations noted.  Cranial Nerves: II: patient does not respond confrontation bilaterally, pupils right 4 mm, left 4 mm,and sluggishly reactive bilaterally III,IV,VI: doll's response present bilaterally. V,VII: corneal reflex present bilaterally  VIII: patient does not respond to verbal stimuli IX,X: gag reflex absent, XI: trapezius strength unable to test bilaterally XII: tongue strength unable to test Motor: Patient moves all extremities spontaneously.  No focal weakness noted Deep Tendon Reflexes:  2+ throughout. Plantars: downgoing bilaterally Cerebellar: Unable to perform  Lab Results: Basic Metabolic Panel:  Recent Labs Lab 07/01/15 0459 07/01/15 0927 07/02/15 0418  NA 139  --  140  K 3.5  --  3.7  CL 104  --  109  CO2 26  --  22  GLUCOSE 115*  --  86  BUN 7  --  8  CREATININE 0.75  --  0.82  CALCIUM 9.0  --  8.8*  MG 1.9  --   --   PHOS   --  3.8  --     Liver Function Tests:  Recent Labs Lab 07/01/15 0459  AST 20  ALT 6*  ALKPHOS 42  BILITOT 0.5  PROT 5.7*  ALBUMIN 3.7   No results for input(s): LIPASE, AMYLASE in the last 168 hours. No results for input(s): AMMONIA in the last 168 hours.  CBC:  Recent Labs Lab 07/01/15 0459 07/02/15 0418  WBC 6.8 10.5  NEUTROABS 4.7  --   HGB 13.9 13.8  HCT 41.1 40.7  MCV 91.1 91.5  PLT 133* 140*    Cardiac Enzymes:  Recent Labs Lab 07/01/15 0459 07/01/15 0927 07/01/15 1608 07/01/15 2102  TROPONINI <0.03 <0.03 <0.03 <0.03    Lipid Panel:  Recent Labs Lab 07/01/15 0927  TRIG 77    CBG:  Recent Labs Lab 07/01/15 0447 07/01/15 1133  GLUCAP 102* 86    Microbiology: Results for orders placed or performed during the hospital encounter of 07/01/15  MRSA PCR Screening     Status: None   Collection Time: 07/01/15 10:11 AM  Result Value Ref Range Status   MRSA by PCR NEGATIVE NEGATIVE Final    Comment:        The GeneXpert MRSA Assay (FDA approved for NASAL specimens only), is one component of a comprehensive MRSA colonization surveillance program. It is not intended to diagnose MRSA infection nor to guide or monitor treatment for MRSA infections.  Coagulation Studies:  Recent Labs  07/01/15 0459  LABPROT 14.5  INR 1.11    Imaging: Ct Head Wo Contrast  07/01/2015   CLINICAL DATA:  Heroin overdose, multiple seizures, post intubation.  EXAM: CT HEAD WITHOUT CONTRAST  TECHNIQUE: Contiguous axial images were obtained from the base of the skull through the vertex without intravenous contrast.  COMPARISON:  None.  FINDINGS: The ventricles and sulci are normal. No intraparenchymal hemorrhage, mass effect nor midline shift. No acute large vascular territory infarcts.  No abnormal extra-axial fluid collections. Basal cisterns are patent.  No skull fracture. The included ocular globes and orbital contents are non-suspicious. The mastoid  aircells and included paranasal sinuses are well-aerated. Life support lines in place.  IMPRESSION: Normal noncontrast CT head.   Electronically Signed   By: Awilda Metro M.D.   On: 07/01/2015 06:03   Dg Chest Port 1 View  07/02/2015   CLINICAL DATA:  Respiratory failure.  Shortness of breath.  EXAM: PORTABLE CHEST - 1 VIEW  COMPARISON:  07/01/2015.  FINDINGS: Endotracheal tube and NG tube in stable position. Heart size normal. Mild bibasilar subsegmental atelectasis. No pleural effusion or pneumothorax. Chest is stable from prior exam.  IMPRESSION: 1. Lines and tubes in stable position. 2. Mild bibasilar subsegmental atelectasis. No acute cardiopulmonary disease otherwise identified.   Electronically Signed   By: Maisie Fus  Register   On: 07/02/2015 07:19   Dg Chest Portable 1 View  07/01/2015   CLINICAL DATA:  Intubation.  Seizures.  EXAM: PORTABLE CHEST - 1 VIEW  COMPARISON:  None.  FINDINGS: Endotracheal tube with tip at the clavicular heads. The orogastric tube reaches the stomach at least.  No evidence of aspiration or edema. No effusion or pneumothorax. Normal heart size and mediastinal contours.  IMPRESSION: 1. Endotracheal and orogastric tubes are in good position. 2. No evidence acute cardiopulmonary disease.   Electronically Signed   By: Marnee Spring M.D.   On: 07/01/2015 05:50    Medications:  I have reviewed the patient's current medications. Scheduled: . antiseptic oral rinse  7 mL Mouth Rinse QID  . chlorhexidine gluconate  15 mL Mouth Rinse BID  . heparin  5,000 Units Subcutaneous 3 times per day  . pantoprazole (PROTONIX) IV  40 mg Intravenous QHS    Assessment/Plan: No further seizures noted.  EEG showed no evidence of continued seizure activity.  Patient on no anticonvulsant therapy at this time due to seizure activity likely being provoked.      Recommendations: 1.  Continue seizure precautions.   2.  Will continue to follow with you   LOS: 1 day   Thana Farr,  MD [facility] [phone] 07/02/2015  9:03 AM

## 2015-07-02 NOTE — ED Provider Notes (Signed)
CSN: [id number]     Arrival date & time 07/01/15  0442 History   First MD Initiated Contact with Patient 07/01/15 0454     Chief Complaint  Patient presents with  . Seizures  . Drug Overdose     (Consider location/radiation/quality/duration/timing/severity/associated sxs/prior Treatment) HPI Comments: Pt comes in with cc of overdose. LEVEL 5 CAVEAT FOR UNRESPONSIVE PATIENT. Per EMS, they were called by family. Pt was unresponsive when they arrived. They report at least 8 episodes of patient stiffening (with extention of upper ext) and upper body tonic clonic movement. They also report that pt uses heroine and there was lots of marijuana around him.  Pt is noted to have multiple episodes in the ER where he hyperextends. No seizure like activity. Eyes were rolling back. Pupils are 5 and equal. EMS has give 1 of narcan with no response. Pt is incontinent. GCS - 5.     Patient is a 34 y.o. male presenting with seizures and Overdose. The history is provided by the patient.  Seizures Drug Overdose    Past Medical History  Diagnosis Date  . Drug abuse    History reviewed. No pertinent past surgical history. No family history on file. Social History  Substance Use Topics  . Smoking status: Current Every Day Smoker  . Smokeless tobacco: None  . Alcohol Use: None    Review of Systems  Neurological: Positive for seizures.      Allergies  Review of patient's allergies indicates no known allergies.  Home Medications   Prior to Admission medications   Not on File   BP 104/62 mmHg  Pulse 60  Temp(Src) 99.4 F (37.4 C) (Oral)  Resp 16  Ht  (1.778 m)  Wt 165 lb 5.5 oz (75 kg)  BMI 23.72 kg/m2  SpO2 97% Physical Exam  Constitutional: He appears well-developed.  HENT:  Head: Atraumatic.  Eyes:  5 mm and equal  Neck: Neck supple.  Cardiovascular: Normal rate.   Pulmonary/Chest: Effort normal. No respiratory distress.  Neurological:  GCS-5  Skin: Skin is warm.   No track marks  Nursing note and vitals reviewed.   ED Course  Procedures (including critical care time) Labs Review Labs Reviewed  CBC WITH DIFFERENTIAL/PLATELET - Abnormal; Notable for the following:    Platelets 133 (*)    All other components within normal limits  COMPREHENSIVE METABOLIC PANEL - Abnormal; Notable for the following:    Glucose, Bld 115 (*)    Total Protein 5.7 (*)    ALT 6 (*)    All other components within normal limits  ACETAMINOPHEN LEVEL - Abnormal; Notable for the following:    Acetaminophen (Tylenol), Serum <10 (*)    All other components within normal limits  BLOOD GAS, ARTERIAL - Abnormal; Notable for the following:    pH, Arterial 7.459 (*)    pCO2 arterial 34.9 (*)    pO2, Arterial 424 (*)    Bicarbonate 25.1 (*)    All other components within normal limits  URINE RAPID DRUG SCREEN, HOSP PERFORMED - Abnormal; Notable for the following:    Opiates POSITIVE (*)    Benzodiazepines POSITIVE (*)    Tetrahydrocannabinol POSITIVE (*)    All other components within normal limits  CBC - Abnormal; Notable for the following:    Platelets 140 (*)    All other components within normal limits  BASIC METABOLIC PANEL - Abnormal; Notable for the following:    Calcium 8.8 (*)    All other  components within normal limits  CBG MONITORING, ED - Abnormal; Notable for the following:    Glucose-Capillary 102 (*)    All other components within normal limits  POCT I-STAT 3, ART BLOOD GAS (G3+) - Abnormal; Notable for the following:    pH, Arterial 7.482 (*)    pO2, Arterial 111.0 (*)    Bicarbonate 26.3 (*)    Acid-Base Excess 3.0 (*)    All other components within normal limits  MRSA PCR SCREENING  TROPONIN I  PROTIME-INR  APTT  SALICYLATE LEVEL  URINALYSIS, ROUTINE W REFLEX MICROSCOPIC (NOT AT [facility])  MAGNESIUM  ETHANOL  TROPONIN I  TROPONIN I  TROPONIN I  PHOSPHORUS  TRIGLYCERIDES  GLUCOSE, CAPILLARY  SODIUM, URINE, RANDOM  I-STAT CG4 LACTIC ACID,  ED    Imaging Review Ct Head Wo Contrast  07/01/2015   CLINICAL DATA:  Heroin overdose, multiple seizures, post intubation.  EXAM: CT HEAD WITHOUT CONTRAST  TECHNIQUE: Contiguous axial images were obtained from the base of the skull through the vertex without intravenous contrast.  COMPARISON:  None.  FINDINGS: The ventricles and sulci are normal. No intraparenchymal hemorrhage, mass effect nor midline shift. No acute large vascular territory infarcts.  No abnormal extra-axial fluid collections. Basal cisterns are patent.  No skull fracture. The included ocular globes and orbital contents are non-suspicious. The mastoid aircells and included paranasal sinuses are well-aerated. Life support lines in place.  IMPRESSION: Normal noncontrast CT head.   Electronically Signed   By: Awilda Metro M.D.   On: 07/01/2015 06:03   Dg Chest Port 1 View  07/02/2015   CLINICAL DATA:  Respiratory failure.  Shortness of breath.  EXAM: PORTABLE CHEST - 1 VIEW  COMPARISON:  07/01/2015.  FINDINGS: Endotracheal tube and NG tube in stable position. Heart size normal. Mild bibasilar subsegmental atelectasis. No pleural effusion or pneumothorax. Chest is stable from prior exam.  IMPRESSION: 1. Lines and tubes in stable position. 2. Mild bibasilar subsegmental atelectasis. No acute cardiopulmonary disease otherwise identified.   Electronically Signed   By: Maisie Fus  Register   On: 07/02/2015 07:19   Dg Chest Portable 1 View  07/01/2015   CLINICAL DATA:  Intubation.  Seizures.  EXAM: PORTABLE CHEST - 1 VIEW  COMPARISON:  None.  FINDINGS: Endotracheal tube with tip at the clavicular heads. The orogastric tube reaches the stomach at least.  No evidence of aspiration or edema. No effusion or pneumothorax. Normal heart size and mediastinal contours.  IMPRESSION: 1. Endotracheal and orogastric tubes are in good position. 2. No evidence acute cardiopulmonary disease.   Electronically Signed   By: Marnee Spring M.D.   On: 07/01/2015  05:50   I have personally reviewed and evaluated these images and lab results as part of my medical decision-making.   EKG Interpretation   Date/Time:  Monday July 01 2015 04:52:45 EDT Ventricular Rate:  82 PR Interval:  193 QRS Duration: 112 QT Interval:  391 QTC Calculation: 457 R Axis:   67 Text Interpretation:  Sinus rhythm Incomplete right bundle branch block ST  elev, probable normal early repol pattern No acute changes Confirmed by  Rhunette Croft, MD, Janey Genta [id number]) on 07/01/2015 5:25:56 AM       INTUBATION Performed by: Derwood Kaplan  Required items: required blood products, implants, devices, and special equipment available Patient identity confirmed: provided demographic data and hospital-assigned identification number Time out: Immediately prior to procedure a "time out" was called to verify the correct patient, procedure, equipment, support staff and site/side marked  as required.  Indications: Airway protection  Intubation method: DIRECT Laryngoscopy   Preoxygenation: BVM  Sedatives: Propofol Paralytic: Rocoronium  Tube Size: 7.5 cuffed  Post-procedure assessment: chest rise and ETCO2 monitor Breath sounds: equal and absent over the epigastrium Tube secured with: ETT holder Chest x-ray interpreted by radiologist and me.  Chest x-ray findings: endotracheal tube in appropriate position  Patient tolerated the procedure well with no immediate complications.     MDM   Final diagnoses:  Status epilepticus  Likely drug overdose  Pt comes in with cc of seizure like activity and unresponsiveness. Had 8 episodes of seizure like activity. GCS 5. Last normal in the evening, and then he allegedly went with his friends. Hx of heroine use and marijuana use. 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 No seizure like activity here. We gave 1 mg ativan iv with no response. Pt was intubated for airway protection. Neuro and CCM consulted. Family  updated.  CRITICAL CARE Performed by: Derwood Kaplan   Total critical care time: 45 min  Critical care time was exclusive of separately billable procedures and treating other patients.  Critical care was necessary to treat or prevent imminent or life-threatening deterioration.  Critical care was time spent personally by me on the following activities: development of treatment plan with patient and/or surrogate as well as nursing, discussions with consultants, evaluation of patient's response to treatment, examination of patient, obtaining history from patient or surrogate, ordering and performing treatments and interventions, ordering and review of laboratory studies, ordering and review of radiographic studies, pulse oximetry and re-evaluation of patient's condition.     Derwood Kaplan, MD 07/03/15 [phone]

## 2015-07-02 NOTE — H&P (Addendum)
PULMONARY / CRITICAL CARE MEDICINE   Name: Casey Mcguire MRN: [id number] DOB: 1981-11-13    ADMISSION DATE:  07/01/2015  REFERRING MD :  EDP  CHIEF COMPLAINT:  AMS   INITIAL PRESENTATION: 34yo male smoker with hx drug abuse presented 8/15 after being found with AMS and ?seizures after likely heroin use.  Intubated on arrival to ER.  PCCM called to admit.   STUDIES:  CT head 8/15>>> neg acute  EEG 8/15>>> UDS 8/15>> POS opiates, benzos, THC  SIGNIFICANT EVENTS:  SUBJECTIVE: awake follows commands  VITAL SIGNS: Temp:  [97.5 F (36.4 C)-98.9 F (37.2 C)] 98.9 F (37.2 C) (08/16 0400) Pulse Rate:  [43-98] 54 (08/16 0835) Resp:  [14-19] 19 (08/16 0835) BP: (89-130)/(55-75) 130/75 mmHg (08/16 0835) SpO2:  [94 %-100 %] 100 % (08/16 0835) FiO2 (%):  [30 %] 30 % (08/16 0835) Weight:  [75 kg (165 lb 5.5 oz)] 75 kg (165 lb 5.5 oz) (08/16 0453) HEMODYNAMICS:   VENTILATOR SETTINGS: Vent Mode:  [-] PSV;CPAP FiO2 (%):  [30 %] 30 % Set Rate:  [14 bmp] 14 bmp Vt Set:  [580 mL] 580 mL PEEP:  [5 cmH20] 5 cmH20 Pressure Support:  [5 cmH20] 5 cmH20 Plateau Pressure:  [15 cmH20-16 cmH20] 16 cmH20 INTAKE / OUTPUT:  Intake/Output Summary (Last 24 hours) at 07/02/15 0901 Last data filed at 07/02/15 0800  Gross per 24 hour  Intake 682.81 ml  Output   2475 ml  Net -1792.19 ml    PHYSICAL EXAMINATION: General:  Young, wdwn male, NAD on vent  Neuro:  rass 0, FC HEENT:  Mm moist, ETT  Cardiovascular:  s1s2 rrr Lungs:  CTA Abdomen:  Soft, +bs , no r/g Musculoskeletal:  Warm and dry, no edema   LABS:  CBC  Recent Labs Lab 07/01/15 0459 07/02/15 0418  WBC 6.8 10.5  HGB 13.9 13.8  HCT 41.1 40.7  PLT 133* 140*   Coag's  Recent Labs Lab 07/01/15 0459  APTT 29  INR 1.11   BMET  Recent Labs Lab 07/01/15 0459 07/02/15 0418  NA 139 140  K 3.5 3.7  CL 104 109  CO2 26 22  BUN 7 8  CREATININE 0.75 0.82  GLUCOSE 115* 86   Electrolytes  Recent Labs Lab  07/01/15 0459 07/01/15 0927 07/02/15 0418  CALCIUM 9.0  --  8.8*  MG 1.9  --   --   PHOS  --  3.8  --    Sepsis Markers  Recent Labs Lab 07/01/15 0504  LATICACIDVEN 2.00   ABG  Recent Labs Lab 07/01/15 0531 07/01/15 1135  PHART 7.459* 7.482*  PCO2ART 34.9* 35.2  PO2ART 424* 111.0*   Liver Enzymes  Recent Labs Lab 07/01/15 0459  AST 20  ALT 6*  ALKPHOS 42  BILITOT 0.5  ALBUMIN 3.7   Cardiac Enzymes  Recent Labs Lab 07/01/15 0927 07/01/15 1608 07/01/15 2102  TROPONINI <0.03 <0.03 <0.03   Glucose  Recent Labs Lab 07/01/15 0447 07/01/15 1133  GLUCAP 102* 86    Imaging Dg Chest Port 1 View  07/02/2015   CLINICAL DATA:  Respiratory failure.  Shortness of breath.  EXAM: PORTABLE CHEST - 1 VIEW  COMPARISON:  07/01/2015.  FINDINGS: Endotracheal tube and NG tube in stable position. Heart size normal. Mild bibasilar subsegmental atelectasis. No pleural effusion or pneumothorax. Chest is stable from prior exam.  IMPRESSION: 1. Lines and tubes in stable position. 2. Mild bibasilar subsegmental atelectasis. No acute cardiopulmonary disease otherwise identified.  Electronically Signed   By: Maisie Fus  Register   On: 07/02/2015 07:19     ASSESSMENT / PLAN:  PULMONARY OETT 8/15>>> Acute respiratory failure - in setting drug OD +/- seizure  P:   Last ABG reviewed, if remains vented reduce mV Wean this am cpap 5 ps 5, goal 30 min , assess rsbi No evidence aspiration  CARDIOVASCULAR Bradycardia - mild, resolved R/o cardiomyopathy  nonspecific conduction delay - likely hyperkalemia from acidosis from ineffective resp acidosis P:  Monitor  Troponin neg , NO further necessary Tele ecg now, if not resolved = echo  RENAL Increase urine output, likely iatrogenic P:   F/u chem  Urine na kvo  GASTROINTESTINAL No active issue  P:   PPI  NPO remain No TF as weaning LFt repeat in am   HEMATOLOGIC No active issue  P:  Heparin SQ , keep until  ambulation Limit blood draws F/u CBC   INFECTIOUS No active issue, no aspiration  P:   Monitor wbc, fever curve off abx  pcxr unimpressive  ENDOCRINE No active issue  P:   Monitor glucose on chem in am   NEUROLOGIC AMS  Drug OD  ??Seizure - neuro following.  Doubt actual seizure.  CT head neg.  P:   RASS goal: 0 Continue propofol with WUA on wean PRN fentanyl  EEG negative Not incline to treat, if seizure occurred likely metabolic / hypoxic related  FAMILY  - Updates:  Pts mother and fiance updated at bedside 8/16  Ccm time 30 min   Mcarthur Rossetti. Tyson Alias, MD, FACP Pgr: [phone] [facility] ]

## 2015-07-02 NOTE — Progress Notes (Signed)
Pt irate and asking to "leave" Dr Tyson Alias notified and ok with pt signing out AMA. Pt educated and informed about possibility of airway compromise post extubation. Pt verbally acknowledged information and stated "I understand, but I just can't sit here." AMA paperwork given to patient, pt signed. pts family at bedside and aware. Pt given shirt from SW and pt escorted to exit of 38M.

## 2015-07-02 NOTE — Care Management Note (Signed)
Case Management Note  Patient Details  Name: Casey Mcguire MRN: [id number] Date of Birth: Nov 20, 1980  Subjective/Objective:      Extubated this morning.  Left AMA              Action/Plan:   Expected Discharge Date:                  Expected Discharge Plan:  Home/Self Care  In-House Referral:  Clinical Social Work  Discharge planning Services     Post Acute Care Choice:    Choice offered to:     DME Arranged:    DME Agency:     HH Arranged:    HH Agency:     Status of Service:  Completed, signed off  Medicare Important Message Given:    Date Medicare IM Given:    Medicare IM give by:    Date Additional Medicare IM Given:    Additional Medicare Important Message give by:     If discussed at Long Length of Stay Meetings, dates discussed:    Additional Comments:  Vangie Bicker, RN 07/02/2015, 1:32 PM

## 2015-08-05 NOTE — Discharge Summary (Signed)
Physician Discharge Summary  Patient ID: BLUE WINTHER MRN: [id number] DOB/AGE: 1980/11/26 34 y.o.  Admit date: 07/01/2015 Discharge date:  07/02/15    Discharge Diagnoses:  Active Problems:   Altered mental status   Acute respiratory failure with hypoxia   Accidental heroin overdose   Encephalopathy acute   Status epilepticus    Brief Summary: KWAME RYLAND is a 35yo male smoker with hx drug abuse presented 8/15 after being found with AMS and ?seizures after likely heroin use. Intubated on arrival to ER. PCCM called to admit.  He was admitted and treated with propofol.  He was seen in consultation by neurology with no further evidence seizure on EEG 8/15, no anticonvulsants started initially as seizure was felt to be provoked by heroin OD.  His mental status improved, he was weaned off propofol and extubated 8/16.  Very soon post extubation he became irate, wanting to leave and ultimately signed out AMA.     STUDIES:  CT head 8/15>>> neg acute  EEG 8/15>>> no evidence continued seizure activity  UDS 8/15>> POS opiates, benzos, THC   Filed Vitals:   07/02/15 0906 07/02/15 0923 07/02/15 1000 07/02/15 1100  BP:  109/59 106/60 104/62  Pulse:  63 88 60  Temp: 99.4 F (37.4 C)     TempSrc: Oral     Resp:  15 13 16   Height:      Weight:      SpO2:  98% 98% 97%     Discharge Labs  BMET Lab 07/01/15 0459 07/02/15 0418  NA 139 140  K 3.5 3.7  CL 104 109  CO2 26 22  BUN 7 8  CREATININE 0.75 0.82  GLUCOSE 115* 86           CBC Lab 07/01/15 0459 07/02/15 0418  WBC 6.8 10.5  HGB 13.9 13.8  HCT 41.1 40.7  PLT 133* 140*           Lab 07/01/15 0531 07/01/15 1135  PHART 7.459* 7.482*  PCO2ART 34.9* 35.2  PO2ART 424* 111.0*               Medication List    Notice    You have not been prescribed any medications.        Disposition: 07-Left Against Medical Advice  Discharged  Condition: JODIE LEINER has met maximum benefit of inpatient care and is medically stable and cleared for discharge.  Patient is pending follow up as above.      Time spent on disposition:  Greater than 35 minutes.   SignedDarlina Sicilian, NP 08/05/2015  9:10 AM Pager: [phone] or [phone]

## 2017-10-01 ENCOUNTER — Emergency Department (HOSPITAL_COMMUNITY)
Admission: EM | Admit: 2017-10-01 | Discharge: 2017-10-01 | Disposition: A | Discharge status: Home / Self Care | Payer: Self-pay | Attending: Emergency Medicine | Admitting: Emergency Medicine

## 2017-10-01 ENCOUNTER — Emergency Department (HOSPITAL_COMMUNITY): Payer: Self-pay

## 2017-10-01 DIAGNOSIS — F17210 Nicotine dependence, cigarettes, uncomplicated: Secondary | ICD-10-CM | POA: Insufficient documentation

## 2017-10-01 DIAGNOSIS — Z79899 Other long term (current) drug therapy: Secondary | ICD-10-CM | POA: Insufficient documentation

## 2017-10-01 DIAGNOSIS — T401X1A Poisoning by heroin, accidental (unintentional), initial encounter: Secondary | ICD-10-CM | POA: Insufficient documentation

## 2017-10-01 DIAGNOSIS — F1910 Other psychoactive substance abuse, uncomplicated: Secondary | ICD-10-CM | POA: Insufficient documentation

## 2017-10-01 LAB — SALICYLATE LEVEL

## 2017-10-01 LAB — CBC WITH DIFFERENTIAL/PLATELET
BASOS ABS: 0 10*3/uL (ref 0.0–0.1)
Basophils Relative: 0 %
EOS ABS: 0 10*3/uL (ref 0.0–0.7)
EOS PCT: 0 %
HCT: 44.5 % (ref 39.0–52.0)
Hemoglobin: 15.2 g/dL (ref 13.0–17.0)
LYMPHS PCT: 7 %
Lymphs Abs: 1.1 10*3/uL (ref 0.7–4.0)
MCH: 32.4 pg (ref 26.0–34.0)
MCHC: 34.2 g/dL (ref 30.0–36.0)
MCV: 94.9 fL (ref 78.0–100.0)
Monocytes Absolute: 1 10*3/uL (ref 0.1–1.0)
Monocytes Relative: 6 %
Neutro Abs: 14.2 10*3/uL — ABNORMAL HIGH (ref 1.7–7.7)
Neutrophils Relative %: 87 %
PLATELETS: 176 10*3/uL (ref 150–400)
RBC: 4.69 MIL/uL (ref 4.22–5.81)
RDW: 12.8 % (ref 11.5–15.5)
WBC: 16.3 10*3/uL — AB (ref 4.0–10.5)

## 2017-10-01 LAB — COMPREHENSIVE METABOLIC PANEL
ALK PHOS: 45 U/L (ref 38–126)
ALT: 9 U/L — ABNORMAL LOW (ref 17–63)
AST: 26 U/L (ref 15–41)
Albumin: 4.8 g/dL (ref 3.5–5.0)
Anion gap: 9 (ref 5–15)
BUN: 13 mg/dL (ref 6–20)
CALCIUM: 9.2 mg/dL (ref 8.9–10.3)
CO2: 29 mmol/L (ref 22–32)
CREATININE: 1.1 mg/dL (ref 0.61–1.24)
Chloride: 100 mmol/L — ABNORMAL LOW (ref 101–111)
GFR calc Af Amer: >60 mL/min (ref >60)
GFR calc non Af Amer: >60 mL/min (ref >60)
GLUCOSE: 117 mg/dL — AB (ref 65–99)
Potassium: 3.7 mmol/L (ref 3.5–5.1)
Sodium: 138 mmol/L (ref 135–145)
Total Bilirubin: 0.9 mg/dL (ref 0.3–1.2)
Total Protein: 7.6 g/dL (ref 6.5–8.1)

## 2017-10-01 LAB — I-STAT CHEM 8, ED
BUN: 14 mg/dL (ref 6–20)
CALCIUM ION: 1.04 mmol/L — AB (ref 1.15–1.40)
Chloride: 100 mmol/L — ABNORMAL LOW (ref 101–111)
Creatinine, Ser: 1 mg/dL (ref 0.61–1.24)
GLUCOSE: 112 mg/dL — AB (ref 65–99)
HEMATOCRIT: 48 % (ref 39.0–52.0)
Hemoglobin: 16.3 g/dL (ref 13.0–17.0)
Potassium: 3.8 mmol/L (ref 3.5–5.1)
SODIUM: 139 mmol/L (ref 135–145)
TCO2: 27 mmol/L (ref 22–32)

## 2017-10-01 LAB — RAPID URINE DRUG SCREEN, HOSP PERFORMED
AMPHETAMINES: NOT DETECTED
BARBITURATES: NOT DETECTED
BENZODIAZEPINES: NOT DETECTED
Cocaine: POSITIVE — AB
Opiates: POSITIVE — AB
Tetrahydrocannabinol: POSITIVE — AB

## 2017-10-01 LAB — I-STAT TROPONIN, ED
TROPONIN I, POC: 0.01 ng/mL (ref 0.00–0.08)
Troponin i, poc: 0.01 ng/mL (ref 0.00–0.08)

## 2017-10-01 LAB — ACETAMINOPHEN LEVEL: Acetaminophen (Tylenol), Serum: <10 ug/mL — ABNORMAL LOW (ref 10–30)

## 2017-10-01 MED ORDER — NALOXONE HCL 0.4 MG/ML IJ SOLN
0.4000 mg | Freq: Once | INTRAMUSCULAR | Status: AC
  Administered 2017-10-01: 0.4 mg via INTRAVENOUS
  Filled 2017-10-01: qty 1

## 2017-10-01 MED ORDER — SODIUM CHLORIDE 0.9 % IV BOLUS (SEPSIS)
500.0000 mL | Freq: Once | INTRAVENOUS | Status: AC
  Administered 2017-10-01: 500 mL via INTRAVENOUS

## 2017-10-01 MED ORDER — NALOXONE HCL 2 MG/2ML IJ SOSY
1.0000 mg | PREFILLED_SYRINGE | Freq: Once | INTRAMUSCULAR | Status: AC
  Administered 2017-10-01: 1 mg via INTRAVENOUS
  Filled 2017-10-01: qty 2

## 2017-10-01 NOTE — ED Triage Notes (Signed)
Pt BIB Girlfriend. Pt has been clean from Heroin for 6-8 months. He used tonight and then went unresponsive. Girlfriend stated that she initiated CPR and called 911. When the ambulance pulled up he sat up and was talking. He refused EMS. He continued to have unresponsive periods and girlfriend brought him here. Pt still having apnea episodes but is easily aroused.

## 2017-10-01 NOTE — ED Notes (Signed)
Pt was given a sprite for PO challenge and he was able to ambulate to the restroom without assistance.

## 2017-10-01 NOTE — ED Provider Notes (Addendum)
[facility]-EMERGENCY DEPT Provider Note   CSN: [id number] Arrival date & time: 10/01/17  0016     History   Chief Complaint Chief Complaint  Patient presents with  . Drug Overdose    HPI Casey Mcguire is a 36 y.o. male.  The history is provided by the EMS personnel and a relative. The history is limited by the condition of the patient.  Drug Overdose  This is a recurrent problem. The current episode started 6 to 12 hours ago. The problem occurs constantly. The problem has not changed since onset.Pertinent negatives include no chest pain, no abdominal pain, no headaches and no shortness of breath. Nothing aggravates the symptoms. Nothing relieves the symptoms. He has tried nothing for the symptoms. The treatment provided no relief.  Relapsed on heroin tonight around six pm but significant other states he stopped breathing at home in the bedroom around 9 pm.  EMS was called.    Past Medical History:  Diagnosis Date  . Drug abuse     Patient Active Problem List   Diagnosis Date Noted  . Status epilepticus (HCC)   . Altered mental status 07/01/2015  . Acute respiratory failure with hypoxia (HCC) 07/01/2015  . Accidental heroin overdose (HCC) 07/01/2015  . Encephalopathy acute 07/01/2015    No past surgical history on file.     Home Medications    Prior to Admission medications   Medication Sig Start Date End Date Taking? Authorizing Provider  acetaminophen (TYLENOL) 500 MG tablet Take 1,000 mg every 6 (six) hours as needed by mouth for moderate pain.   Yes [provider]    Family History No family history on file.  Social History Social History   Tobacco Use  . Smoking status: Current Every Day Smoker  Substance Use Topics  . Alcohol use: Not on file  . Drug use: Yes    Types: Marijuana    Comment: heroin     Allergies   Patient has no known allergies.   Review of Systems Review of Systems  Unable to perform ROS:  Other  Respiratory: Negative for shortness of breath.   Cardiovascular: Negative for chest pain.  Gastrointestinal: Negative for abdominal pain.  Neurological: Negative for headaches.     Physical Exam Updated Vital Signs BP 107/67 (BP Location: Right Arm)   Pulse 63   Temp 98.1 F (36.7 C) (Oral)   Resp 11   SpO2 95%   Physical Exam  Constitutional: He appears well-developed and well-nourished.  HENT:  Head: Normocephalic and atraumatic.  Mouth/Throat: No oropharyngeal exudate.  Eyes:  Pinpoint pupils  Neck: Normal range of motion. Neck supple. No JVD present.  Cardiovascular: Regular rhythm, normal heart sounds and intact distal pulses. Bradycardia present.  Pulmonary/Chest: Effort normal and breath sounds normal. No stridor. Bradypnea noted. No respiratory distress. He has no wheezes. He has no rales.  Abdominal: Soft. Bowel sounds are normal. He exhibits no mass. There is no tenderness. There is no guarding.  Musculoskeletal: Normal range of motion.  Neurological: He displays normal reflexes. GCS eye subscore is 2. GCS verbal subscore is 1. GCS motor subscore is 5.  Skin: Skin is warm and dry. Capillary refill takes less than 2 seconds. He is not diaphoretic.     ED Treatments / Results  Labs (all labs ordered are listed, but only abnormal results are   Results for orders placed or performed during the hospital encounter of 10/01/17  CBC with Differential/Platelet  Result Value  Ref Range   WBC 16.3 (H) 4.0 - 10.5 K/uL   RBC 4.69 4.22 - 5.81 MIL/uL   Hemoglobin 15.2 13.0 - 17.0 g/dL   HCT 16.144.5 09.639.0 - 04.552.0 %   MCV 94.9 78.0 - 100.0 fL   MCH 32.4 26.0 - 34.0 pg   MCHC 34.2 30.0 - 36.0 g/dL   RDW 40.912.8 81.111.5 - 91.415.5 %   Platelets 176 150 - 400 K/uL   Neutrophils Relative % 87 %   Neutro Abs 14.2 (H) 1.7 - 7.7 K/uL   Lymphocytes Relative 7 %   Lymphs Abs 1.1 0.7 - 4.0 K/uL   Monocytes Relative 6 %   Monocytes Absolute 1.0 0.1 - 1.0 K/uL   Eosinophils Relative 0 %     Eosinophils Absolute 0.0 0.0 - 0.7 K/uL   Basophils Relative 0 %   Basophils Absolute 0.0 0.0 - 0.1 K/uL  Acetaminophen level  Result Value Ref Range   Acetaminophen (Tylenol), Serum <10 (L) 10 - 30 ug/mL  Salicylate level  Result Value Ref Range   Salicylate Lvl <7.0 2.8 - 30.0 mg/dL  Rapid urine drug screen (hospital performed)  Result Value Ref Range   Opiates POSITIVE (A) NONE DETECTED   Cocaine POSITIVE (A) NONE DETECTED   Benzodiazepines NONE DETECTED NONE DETECTED   Amphetamines NONE DETECTED NONE DETECTED   Tetrahydrocannabinol POSITIVE (A) NONE DETECTED   Barbiturates NONE DETECTED NONE DETECTED  Comprehensive metabolic panel  Result Value Ref Range   Sodium 138 135 - 145 mmol/L   Potassium 3.7 3.5 - 5.1 mmol/L   Chloride 100 (L) 101 - 111 mmol/L   CO2 29 22 - 32 mmol/L   Glucose, Bld 117 (H) 65 - 99 mg/dL   BUN 13 6 - 20 mg/dL   Creatinine, Ser 7.821.10 0.61 - 1.24 mg/dL   Calcium 9.2 8.9 - 95.610.3 mg/dL   Total Protein 7.6 6.5 - 8.1 g/dL   Albumin 4.8 3.5 - 5.0 g/dL   AST 26 15 - 41 U/L   ALT 9 (L) 17 - 63 U/L   Alkaline Phosphatase 45 38 - 126 U/L   Total Bilirubin 0.9 0.3 - 1.2 mg/dL   GFR calc non Af Amer >60 >60 mL/min   GFR calc Af Amer >60 >60 mL/min   Anion gap 9 5 - 15  I-Stat Chem 8, ED  Result Value Ref Range   Sodium 139 135 - 145 mmol/L   Potassium 3.8 3.5 - 5.1 mmol/L   Chloride 100 (L) 101 - 111 mmol/L   BUN 14 6 - 20 mg/dL   Creatinine, Ser 2.131.00 0.61 - 1.24 mg/dL   Glucose, Bld 086112 (H) 65 - 99 mg/dL   Calcium, Ion 5.781.04 (L) 1.15 - 1.40 mmol/L   TCO2 27 22 - 32 mmol/L   Hemoglobin 16.3 13.0 - 17.0 g/dL   HCT 46.948.0 62.939.0 - 52.852.0 %  I-stat troponin, ED  Result Value Ref Range   Troponin i, poc 0.01 0.00 - 0.08 ng/mL   Comment 3           Dg Chest Portable 1 View  Result Date: 10/01/2017 CLINICAL DATA:  Status post heroin overdose. EXAM: PORTABLE CHEST 1 VIEW COMPARISON:  Chest radiograph performed 07/02/2015 FINDINGS: The lungs are well-aerated  and clear. There is no evidence of focal opacification, pleural effusion or pneumothorax. The cardiomediastinal silhouette is within normal limits. No acute osseous abnormalities are seen. IMPRESSION: No acute cardiopulmonary process seen. Electronically Signed   By: Leotis ShamesJeffery  Chang M.D.   On: 10/01/2017 01:36   ]EKG  EKG Interpretation  Date/Time:  Friday October 01 2017 00:23:53 EST Ventricular Rate:  51 PR Interval:    QRS Duration: 107 QT Interval:  408 QTC Calculation: 376 R Axis:   78 Text Interpretation:  Sinus rhythm RSR' in V1 or V2, probably normal variant REPOLARIZATION ABNORMALITY Confirmed by Vibhav Waddill ([id number]) on 10/01/2017 1:51:02 AM       Radiology Dg Chest Portable 1 View  Result Date: 10/01/2017 CLINICAL DATA:  Status post heroin overdose. EXAM: PORTABLE CHEST 1 VIEW COMPARISON:  Chest radiograph performed 07/02/2015 FINDINGS: The lungs are well-aerated and clear. There is no evidence of focal opacification, pleural effusion or pneumothorax. The cardiomediastinal silhouette is within normal limits. No acute osseous abnormalities are seen. IMPRESSION: No acute cardiopulmonary process seen. Electronically Signed   By: Roanna RaiderJeffery  Chang M.D.   On: 10/01/2017 01:36    Procedures Procedures (including critical care time)  Medications Ordered in ED Medications  sodium chloride 0.9 % bolus 500 mL (500 mLs Intravenous New Bag/Given 10/01/17 0052)  naloxone [facility](NARCAN) injection 0.4 mg (0.4 mg Intravenous Given 10/01/17 0051)  naloxone (NARCAN) injection 1 mg (1 mg Intravenous Given 10/01/17 0058)     Now alert and oriented, ambulated about the department and has been observed for a prolonged period of time without clinical decompensation.  He is markedly improved at this time and has family present who will take him home.    Final Clinical Impressions(s) / ED Diagnoses   Polysubstance abuse and accidental overdose.  Stop using drugs.  All questions answered to the  patient's satisfaction.    Strict return precautions for fever, global weakness, blood in the urine, abdominal distention, vomiting, no drainage from the foley catheter, swelling or the lips or tongue, chest pain, dyspnea on exertion, new weakness or numbness changes in vision or speech, fevers, weakness persistent pain, Inability to tolerate liquids or food, changes in voice cough, altered mental status or any concerns. No signs of systemic illness or infection. The patient is nontoxic-appearing on exam and vital signs are within normal limits.    I have reviewed the triage vital signs and the nursing notes. Pertinent labs &imaging results that were available during my care of the patient were reviewed by me and considered in my medical decision making (see chart for details).  After history, exam, and medical workup I feel the patient has been appropriately medically screened and is safe for discharge home. Pertinent diagnoses were discussed with the patient. Patient was given return precautions     Nikolas Casher, MD 10/01/17 [id number]    Nicanor AlconPalumbo, Thanya Cegielski, MD 10/01/17 [phone]

## 2018-01-21 ENCOUNTER — Other Ambulatory Visit: Payer: Self-pay

## 2018-01-21 ENCOUNTER — Encounter (HOSPITAL_COMMUNITY): Payer: Self-pay

## 2018-01-21 ENCOUNTER — Emergency Department (HOSPITAL_COMMUNITY)
Admission: EM | Admit: 2018-01-21 | Discharge: 2018-01-21 | Disposition: A | Discharge status: Home / Self Care | Payer: Self-pay | Attending: Emergency Medicine | Admitting: Emergency Medicine

## 2018-01-21 DIAGNOSIS — T401X1A Poisoning by heroin, accidental (unintentional), initial encounter: Secondary | ICD-10-CM | POA: Insufficient documentation

## 2018-01-21 DIAGNOSIS — F17210 Nicotine dependence, cigarettes, uncomplicated: Secondary | ICD-10-CM | POA: Insufficient documentation

## 2018-01-21 MED ORDER — NALOXONE HCL 4 MG/0.1ML NA LIQD
1.0000 | Freq: Once | NASAL | Status: AC | PRN
  Administered 2018-01-21: 1 via NASAL
  Filled 2018-01-21: qty 4

## 2018-01-21 NOTE — ED Notes (Signed)
Pt refused IV  

## 2018-01-21 NOTE — ED Provider Notes (Signed)
[facility]-EMERGENCY DEPT Provider Note   CSN: [id number] Arrival date & time: 01/21/18  1733     History   Chief Complaint No chief complaint on file.   HPI Casey Mcguire is a 37 y.o. male.  HPI Patient presents after heroin overdose.  Reportedly was found by son.  Had chest compressions done.  EMS gave Narcan which had patient awake.  Patient states he snorted heroin.  Previous history of abuse but has not used since November.  No chest pain.  Denies suicidal or homicidal thoughts.  Denies other substance abuse.  Rare alcohol use. Past Medical History:  Diagnosis Date  . Drug abuse [facility])     Patient Active Problem List   Diagnosis Date Noted  . Status epilepticus (HCC)   . Altered mental status 07/01/2015  . Acute respiratory failure with hypoxia (HCC) 07/01/2015  . Accidental heroin overdose (HCC) 07/01/2015  . Encephalopathy acute 07/01/2015    History reviewed. No pertinent surgical history.     Home Medications    Prior to Admission medications   Not on File    Family History History reviewed. No pertinent family history.  Social History Social History   Tobacco Use  . Smoking status: Current Every Day Smoker  Substance Use Topics  . Alcohol use: Not on file  . Drug use: Yes    Types: Marijuana    Comment: heroin     Allergies   Patient has no known allergies.   Review of Systems Review of Systems  Constitutional: Negative for appetite change and fever.  Respiratory: Negative for shortness of breath.   Cardiovascular: Negative for chest pain.  Gastrointestinal: Negative for abdominal pain.  Genitourinary: Negative for enuresis.  Skin: Negative for rash.  Neurological: Negative for weakness.  Hematological: Negative for adenopathy.  Psychiatric/Behavioral: Negative for suicidal ideas.     Physical Exam Updated Vital Signs BP 134/88 (BP Location: Left Arm)   Pulse 87   Temp 98 F (36.7 C) (Oral)   Resp 15    Ht 5\' 10"  (1.778 m)   Wt 65.8 kg (145 lb)   SpO2 98%   BMI 20.81 kg/m   Physical Exam  Constitutional: He appears well-developed.  HENT:  Head: Normocephalic.  Eyes: EOM are normal.  Neck: Neck supple.  Cardiovascular: Normal rate.  Pulmonary/Chest: Effort normal.  Abdominal: Soft.  Musculoskeletal: He exhibits no tenderness.  Neurological: He is alert.  Skin: Skin is warm. Capillary refill takes less than 2 seconds.     ED Treatments / Results  Labs (all labs ordered are listed, but only abnormal results are displayed) Labs Reviewed - No data to display  EKG  EKG Interpretation None       Radiology No results found.  Procedures Procedures (including critical care time)  Medications Ordered in ED Medications  naloxone (NARCAN) nasal spray 4 mg/0.1 mL (1 spray Nasal Provided for home use 01/21/18 1850)     Initial Impression / Assessment and Plan / ED Course  I have reviewed the triage vital signs and the nursing notes.  Pertinent labs & imaging results that were available during my care of the patient were reviewed by me and considered in my medical decision making (see chart for details).     Patient with heroin overdose.  Had been unresponsive and had compressions.  Now back at baseline.  Is somewhat tearful.  Given resources.  Also given to go Narcan for risk reduction.  Discharge home.  Final Clinical  Impressions(s) / ED Diagnoses   Final diagnoses:  Heroin overdose, accidental or unintentional, initial encounter [facility](HCC)    ED Discharge Orders    None       Benjiman CorePickering, Epimenio Schetter, MD 01/23/18 [phone]

## 2018-01-21 NOTE — ED Triage Notes (Signed)
Pt BIB EMS- Per EMS, pt snorted an unknown amount of pills.  Was found by son unconscious who started to preform chest compressions until fist responders arrived. Fire noted pin point pupils and administered 2mg  narcan IN. Pt A/O x4 with EMS. Pt reports it was heroin that he snorted. Pt tearful throughout triage.

## 2018-04-11 ENCOUNTER — Encounter (HOSPITAL_COMMUNITY): Payer: Self-pay | Admitting: Emergency Medicine

## 2018-04-11 ENCOUNTER — Emergency Department (HOSPITAL_COMMUNITY)
Admission: EM | Admit: 2018-04-11 | Discharge: 2018-04-11 | Disposition: A | Discharge status: Home / Self Care | Payer: Self-pay | Attending: Emergency Medicine | Admitting: Emergency Medicine

## 2018-04-11 DIAGNOSIS — K047 Periapical abscess without sinus: Secondary | ICD-10-CM | POA: Insufficient documentation

## 2018-04-11 DIAGNOSIS — F17200 Nicotine dependence, unspecified, uncomplicated: Secondary | ICD-10-CM | POA: Insufficient documentation

## 2018-04-11 DIAGNOSIS — K029 Dental caries, unspecified: Secondary | ICD-10-CM | POA: Insufficient documentation

## 2018-04-11 MED ORDER — HYDROCODONE-ACETAMINOPHEN 5-325 MG PO TABS
1.0000 | ORAL_TABLET | Freq: Once | ORAL | Status: AC
  Administered 2018-04-11: 1 via ORAL
  Filled 2018-04-11: qty 1

## 2018-04-11 MED ORDER — CLINDAMYCIN HCL 300 MG PO CAPS
300.0000 mg | ORAL_CAPSULE | Freq: Once | ORAL | Status: AC
  Administered 2018-04-11: 300 mg via ORAL
  Filled 2018-04-11: qty 1

## 2018-04-11 MED ORDER — CLINDAMYCIN HCL 300 MG PO CAPS: 300.0000 mg | ORAL_CAPSULE | Freq: Three times a day (TID) | ORAL | 0 refills | Status: DC

## 2018-04-11 MED ORDER — NAPROXEN 500 MG PO TABS: 500.0000 mg | ORAL_TABLET | Freq: Two times a day (BID) | ORAL | 0 refills | Status: DC

## 2018-04-11 NOTE — ED Provider Notes (Signed)
[facility]-EMERGENCY DEPT Provider Note   CSN: [id number] Arrival date & time: 04/11/18  1044     History   Chief Complaint Chief Complaint  Patient presents with  . Dental Pain  . Facial Swelling    HPI Casey Mcguire is a 37 y.o. male who presents to the ED with dental pain. Patient reports that he has been on a camping trip and had to come home due to the swelling in his face and dental pain. The swelling started yesterday and pain started this morning. Patient has had dental problems in the past and has several decayed teeth.   HPI  Past Medical History:  Diagnosis Date  . Drug abuse [facility])     Patient Active Problem List   Diagnosis Date Noted  . Status epilepticus (HCC)   . Altered mental status 07/01/2015  . Acute respiratory failure with hypoxia (HCC) 07/01/2015  . Accidental heroin overdose (HCC) 07/01/2015  . Encephalopathy acute 07/01/2015    History reviewed. No pertinent surgical history.      Home Medications    Prior to Admission medications   Medication Sig Start Date End Date Taking? Authorizing Provider  clindamycin (CLEOCIN) 300 MG capsule Take 1 capsule (300 mg total) by mouth 3 (three) times daily. 04/11/18   Janne Napoleon, NP  naproxen (NAPROSYN) 500 MG tablet Take 1 tablet (500 mg total) by mouth 2 (two) times daily. 04/11/18   Janne Napoleon, NP    Family History No family history on file.  Social History Social History   Tobacco Use  . Smoking status: Current Every Day Smoker  . Smokeless tobacco: Never Used  Substance Use Topics  . Alcohol use: Not on file  . Drug use: Yes    Types: Marijuana    Comment: heroin     Allergies   Patient has no known allergies.   Review of Systems Review of Systems  HENT: Positive for dental problem. Negative for sore throat and trouble swallowing.   Hematological: Positive for adenopathy.  All other systems reviewed and are negative.    Physical Exam Updated Vital  Signs BP 132/74 (BP Location: Left Arm)   Pulse 71   Temp 97.9 F (36.6 C) (Oral)   Resp 17   SpO2 99%   Physical Exam  Constitutional: He appears well-developed and well-nourished. No distress.  HENT:  Nose: Nose normal.  Mouth/Throat: Uvula is midline. No trismus in the jaw. Abnormal dentition. Dental abscesses and dental caries present. No oropharyngeal exudate, posterior oropharyngeal edema or posterior oropharyngeal erythema.  Left side facial swelling, multiple dental caries. Abscess tooth left upper first molar. Tender on exam.   Eyes: EOM are normal.  Neck: Normal range of motion. Neck supple.  Cardiovascular: Normal rate.  Pulmonary/Chest: Effort normal.  Musculoskeletal: Normal range of motion.  Lymphadenopathy:    He has no cervical adenopathy.  Neurological: He is alert.  Skin: Skin is warm and dry.  Psychiatric: He has a normal mood and affect.  Nursing note and vitals reviewed.    ED Treatments / Results  Labs (all labs ordered are listed, but only abnormal results are displayed) Labs Reviewed - No data to display  Radiology No results found.  Procedures Procedures (including critical care time)  Medications Ordered in ED Medications  clindamycin (CLEOCIN) capsule 300 mg (300 mg Oral Given 04/11/18 1214)  HYDROcodone-acetaminophen (NORCO/VICODIN) 5-325 MG per tablet 1 tablet (1 tablet Oral Given 04/11/18 1214)     Initial  Impression / Assessment and Plan / ED Course  I have reviewed the triage vital signs and the nursing notes. Patient with toothache. Abscess noted. .  Exam unconcerning for Ludwig's angina or spread of infection.  Will treat with Clindamycin and anti-inflammatories medicine.  Urged patient to follow-up with dentist.  Referral given.  Final Clinical Impressions(s) / ED Diagnoses   Final diagnoses:  Dental abscess  Pain due to dental caries    ED Discharge Orders        Ordered    clindamycin (CLEOCIN) 300 MG capsule  3 times daily      04/11/18 1215    naproxen (NAPROSYN) 500 MG tablet  2 times daily     04/11/18 1215       Kerrie [place], NP 04/11/18 1226    Tilden Fossa, MD 04/12/18 [phone]

## 2018-04-11 NOTE — ED Triage Notes (Signed)
Patient here from home with complaints of dental pain and facial swelling that started this morning.

## 2018-04-11 NOTE — Discharge Instructions (Addendum)
Call Dr. Olen Pel office for an appointment. You will not need to take ibuprofen with the medication we prescribe for you.

## 2018-04-11 NOTE — ED Notes (Signed)
Bed: WTR5 Expected date:  Expected time:  Means of arrival:  Comments: 

## 2018-08-28 ENCOUNTER — Other Ambulatory Visit: Payer: Self-pay

## 2018-08-28 ENCOUNTER — Encounter (HOSPITAL_COMMUNITY): Payer: Self-pay | Admitting: Emergency Medicine

## 2018-08-28 ENCOUNTER — Emergency Department (HOSPITAL_COMMUNITY)
Admission: EM | Admit: 2018-08-28 | Discharge: 2018-08-29 | Disposition: A | Discharge status: Other Acute Inpatient Hospital | Payer: Medicaid Other | Attending: Emergency Medicine | Admitting: Emergency Medicine

## 2018-08-28 DIAGNOSIS — F17200 Nicotine dependence, unspecified, uncomplicated: Secondary | ICD-10-CM | POA: Insufficient documentation

## 2018-08-28 DIAGNOSIS — F332 Major depressive disorder, recurrent severe without psychotic features: Secondary | ICD-10-CM | POA: Diagnosis present

## 2018-08-28 DIAGNOSIS — F1120 Opioid dependence, uncomplicated: Secondary | ICD-10-CM | POA: Diagnosis present

## 2018-08-28 HISTORY — DX: Depression, unspecified: F32.A

## 2018-08-28 HISTORY — DX: Major depressive disorder, single episode, unspecified: F32.9

## 2018-08-28 LAB — ETHANOL

## 2018-08-28 LAB — COMPREHENSIVE METABOLIC PANEL
ALT: 8 U/L (ref 0–44)
ANION GAP: 7 (ref 5–15)
AST: 17 U/L (ref 15–41)
Albumin: 3.9 g/dL (ref 3.5–5.0)
Alkaline Phosphatase: 39 U/L (ref 38–126)
BILIRUBIN TOTAL: 0.5 mg/dL (ref 0.3–1.2)
BUN: 9 mg/dL (ref 6–20)
CHLORIDE: 104 mmol/L (ref 98–111)
CO2: 31 mmol/L (ref 22–32)
Calcium: 8.7 mg/dL — ABNORMAL LOW (ref 8.9–10.3)
Creatinine, Ser: 0.73 mg/dL (ref 0.61–1.24)
Glucose, Bld: 104 mg/dL — ABNORMAL HIGH (ref 70–99)
POTASSIUM: 4.1 mmol/L (ref 3.5–5.1)
Sodium: 142 mmol/L (ref 135–145)
TOTAL PROTEIN: 6.4 g/dL — AB (ref 6.5–8.1)

## 2018-08-28 LAB — CBC
HCT: 43.4 % (ref 39.0–52.0)
Hemoglobin: 14.1 g/dL (ref 13.0–17.0)
MCH: 31.1 pg (ref 26.0–34.0)
MCHC: 32.5 g/dL (ref 30.0–36.0)
MCV: 95.8 fL (ref 80.0–100.0)
NRBC: 0 % (ref 0.0–0.2)
PLATELETS: 225 10*3/uL (ref 150–400)
RBC: 4.53 MIL/uL (ref 4.22–5.81)
RDW: 12.6 % (ref 11.5–15.5)
WBC: 9.1 10*3/uL (ref 4.0–10.5)

## 2018-08-28 LAB — RAPID URINE DRUG SCREEN, HOSP PERFORMED
Amphetamines: NOT DETECTED
Barbiturates: NOT DETECTED
Benzodiazepines: NOT DETECTED
COCAINE: NOT DETECTED
Opiates: POSITIVE — AB
Tetrahydrocannabinol: POSITIVE — AB

## 2018-08-28 LAB — ACETAMINOPHEN LEVEL: ACETAMINOPHEN (TYLENOL), SERUM: 16 ug/mL (ref 10–30)

## 2018-08-28 LAB — SALICYLATE LEVEL

## 2018-08-28 MED ORDER — NAPROXEN 500 MG PO TABS: 500.0000 mg | ORAL_TABLET | Freq: Two times a day (BID) | ORAL | Status: DC | PRN

## 2018-08-28 MED ORDER — CLONIDINE HCL 0.1 MG PO TABS
0.1000 mg | ORAL_TABLET | Freq: Four times a day (QID) | ORAL | Status: DC
  Administered 2018-08-28 – 2018-08-29 (×3): 0.1 mg via ORAL
  Filled 2018-08-28 (×3): qty 1

## 2018-08-28 MED ORDER — CLONIDINE HCL 0.1 MG PO TABS: 0.1000 mg | ORAL_TABLET | Freq: Every day | ORAL | Status: DC

## 2018-08-28 MED ORDER — CLONIDINE HCL 0.1 MG PO TABS: 0.1000 mg | ORAL_TABLET | Freq: Two times a day (BID) | ORAL | Status: DC

## 2018-08-28 MED ORDER — ONDANSETRON 4 MG PO TBDP: 4.0000 mg | ORAL_TABLET | Freq: Four times a day (QID) | ORAL | Status: DC | PRN

## 2018-08-28 MED ORDER — DICYCLOMINE HCL 20 MG PO TABS: 20.0000 mg | ORAL_TABLET | Freq: Four times a day (QID) | ORAL | Status: DC | PRN

## 2018-08-28 MED ORDER — HYDROXYZINE HCL 25 MG PO TABS: 25.0000 mg | ORAL_TABLET | Freq: Four times a day (QID) | ORAL | Status: DC | PRN

## 2018-08-28 MED ORDER — METHOCARBAMOL 500 MG PO TABS: 500.0000 mg | ORAL_TABLET | Freq: Three times a day (TID) | ORAL | Status: DC | PRN

## 2018-08-28 MED ORDER — LOPERAMIDE HCL 2 MG PO CAPS: 2.0000 mg | ORAL_CAPSULE | ORAL | Status: DC | PRN

## 2018-08-28 NOTE — ED Provider Notes (Signed)
[facility]-EMERGENCY DEPT Provider Note   CSN: [id number] Arrival date & time: 08/28/18  1532     History   Chief Complaint Chief Complaint  Patient presents with  . Depression  . Suicidal  . Addiction Problem    HPI Casey Mcguire is a 37 y.o. male.  37 year old male with history of heroin addiction presents with suicidal ideations with plan to overdose.  No prior history of overdose.  Does endorse history of depression which she has not medicated for.  States that he snorts heroin every day.  Denies any concomitant use of alcohol or other illicit drugs.  ~Sister about his suicidal ideations and he was brought here for further evaluation.  Denies any intentional ingestions currently.     Past Medical History:  Diagnosis Date  . Depression   . Drug abuse [facility])     Patient Active Problem List   Diagnosis Date Noted  . Status epilepticus (HCC)   . Altered mental status 07/01/2015  . Acute respiratory failure with hypoxia (HCC) 07/01/2015  . Accidental heroin overdose (HCC) 07/01/2015  . Encephalopathy acute 07/01/2015    History reviewed. No pertinent surgical history.      Home Medications    Prior to Admission medications   Medication Sig Start Date End Date Taking? Authorizing Provider  acetaminophen (TYLENOL) 325 MG tablet Take 650 mg by mouth every 6 (six) hours as needed for moderate pain.   Yes [provider]  clindamycin (CLEOCIN) 300 MG capsule Take 1 capsule (300 mg total) by mouth 3 (three) times daily. Patient not taking: Reported on 08/28/2018 04/11/18   Janne Napoleon, NP  naproxen (NAPROSYN) 500 MG tablet Take 1 tablet (500 mg total) by mouth 2 (two) times daily. Patient not taking: Reported on 08/28/2018 04/11/18   Janne Napoleon, NP    Family History History reviewed. No pertinent family history.  Social History Social History   Tobacco Use  . Smoking status: Current Every Day Smoker  . Smokeless tobacco:  Never Used  Substance Use Topics  . Alcohol use: Not on file  . Drug use: Yes    Types: Marijuana    Comment: heroin     Allergies   Patient has no known allergies.   Review of Systems Review of Systems  All other systems reviewed and are negative.    Physical Exam Updated Vital Signs BP 113/71 (BP Location: Left Arm)   Pulse 83   Temp 98.7 F (37.1 C) (Oral)   Resp 16   SpO2 98%   Physical Exam  Constitutional: He is oriented to person, place, and time. He appears well-developed and well-nourished.  Non-toxic appearance. No distress.  HENT:  Head: Normocephalic and atraumatic.  Eyes: Pupils are equal, round, and reactive to light. Conjunctivae, EOM and lids are normal.  Neck: Normal range of motion. Neck supple. No tracheal deviation present. No thyroid mass present.  Cardiovascular: Normal rate, regular rhythm and normal heart sounds. Exam reveals no gallop.  No murmur heard. Pulmonary/Chest: Effort normal and breath sounds normal. No stridor. No respiratory distress. He has no decreased breath sounds. He has no wheezes. He has no rhonchi. He has no rales.  Abdominal: Soft. Normal appearance and bowel sounds are normal. He exhibits no distension. There is no tenderness. There is no rebound and no CVA tenderness.  Musculoskeletal: Normal range of motion. He exhibits no edema or tenderness.  Neurological: He is alert and oriented to person, place, and time. He  has normal strength. No cranial nerve deficit or sensory deficit. GCS eye subscore is 4. GCS verbal subscore is 5. GCS motor subscore is 6.  Skin: Skin is warm and dry. No abrasion and no rash noted.  Psychiatric: His affect is blunt. His speech is delayed. He is withdrawn. He expresses suicidal ideation. He expresses suicidal plans.  Nursing note and vitals reviewed.    ED Treatments / Results  Labs (all labs ordered are listed, but only abnormal results are displayed) Labs Reviewed  CBC  COMPREHENSIVE  METABOLIC PANEL  ETHANOL  SALICYLATE LEVEL  ACETAMINOPHEN LEVEL  RAPID URINE DRUG SCREEN, HOSP PERFORMED    EKG None  Radiology No results found.  Procedures Procedures (including critical care time)  Medications Ordered in ED Medications - No data to display   Initial Impression / Assessment and Plan / ED Course  I have reviewed the triage vital signs and the nursing notes.  Pertinent labs & imaging results that were available during my care of the patient were reviewed by me and considered in my medical decision making (see chart for details).     Patient also has a stable here.  Medical clearance labs are pending but anticipate the patient be medically cleared and will consult TTS for placement  Final Clinical Impressions(s) / ED Diagnoses   Final diagnoses:  None    ED Discharge Orders    None       Lorre Nick, MD 08/28/18 1621

## 2018-08-28 NOTE — Patient Outreach (Signed)
ED Peer Support Specialist Patient Intake (Complete at intake & 30-60 Day Follow-up)  Name: ONDRA DEBOARD  MRN: [id number]  Age: 37 y.o.   Date of Admission: 08/28/2018  Intake: Initial Comments:      Primary Reason Admitted: heroin use, depression, SI  Lab values: Alcohol/ETOH: Negative Positive UDS? Drug screen not completed Amphetamines: Drug screen not completed Barbiturates: Drug screen not completed Benzodiazepines: Drug screen not completed Cocaine: Drug screen not completed Opiates: Drug screen not completed Cannabinoids: Drug screen not completed  Demographic information: Gender: Male Ethnicity: White Marital Status: Single Insurance Status: Medicaid(Medicaid potenial) Ecologist (Work Neurosurgeon, Physicist, medical, etc.: No Lives with: Partner/Spouse, Child under 1 years(3 children, girlfriend) Living situation: House/Apartment  Reported Patient History: Patient reported health conditions: None Patient aware of HIV and hepatitis status: No  In past year, has patient visited ED for any reason? Yes  Number of ED visits: 2  Reason(s) for visit: accidental heroin overdose  In past year, has patient been hospitalized for any reason? No  Number of hospitalizations:    Reason(s) for hospitalization:    In past year, has patient been arrested? No  Number of arrests:    Reason(s) for arrest:    In past year, has patient been incarcerated? No  Number of incarcerations:    Reason(s) for incarceration:    In past year, has patient received medication-assisted treatment? No  In past year, patient received the following treatments: Peer support group(NA meetings)  In past year, has patient received any harm reduction services? Yes  Did this include any of the following? Making contact with an SEP([facility] )  In past year, has patient received care from a mental health provider for diagnosis other than SUD? No  In past year, is  this first time patient has overdosed? No  Number of past overdoses: 2  In past year, is this first time patient has been hospitalized for an overdose? Yes  Number of hospitalizations for overdose(s): 2  Is patient currently receiving treatment for a mental health diagnosis? No  Patient reports experiencing difficulty participating in SUD treatment: No    Most important reason(s) for this difficulty?    Has patient received prior services for treatment? No  In past, patient has received services from following agencies:    Plan of Care:  Suggested follow up at these agencies/treatment centers: (TTS is currently looking at possible 300 hall admission. [facility] with follow up with the patient if he is admitted. )  Other information: [facility] met with the patient to provide substance use recovery support and help with resources. [facility] will follow up with the patient at [facility] to provide further substance use recovery support if admitted.    Mason Jim, [place]  08/28/2018 5:44 PM

## 2018-08-28 NOTE — ED Triage Notes (Signed)
Pt c/o depression x several years and pt c/o suicidal thoughts with no plan. Pt states he has attempted detox on own from heroin use, but unsuccessful. Pt reports he last used heroin last night.

## 2018-08-28 NOTE — BH Assessment (Signed)
Assessment Note  Casey Mcguire is an 37 y.o. male who presented to [facility] with suicidal ideation with a plan to overdose on heroin. Patient states that over the past week that he has had increasing thoughts about overdosing and having dreams that he did kill himself by overdose.  Patient states that he has a lot of problems he is trying to deal with and he states that he has been using heroin to self-medicate his issues, but he states that in actuality his use is creating most of his issues.  Patient states that he has become increasingly depressed and experiencing a lot of anxiety. Patient states that his life is falling apart.  Patient states: "I know what I need to do to get better, but I just cannot seem to do it on my own."   I was clean for six months, but relapsed. Patient states that he has been snorting a gram of heroin daily with his last use being yesterday.  He denies any current withdrawal complications. Patient states that his girlfriend would leave him if he has a place to go because he states that she is really tired of the way things are going.  Patient states that they have three children together ages 20, 2 and 3 months.  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  Patient denies HI/Psychosis.  Patient states that he is not sleeping but two hours at night and he is not eating and states that he has lost twenty pounds in the past two months. Patient denies any history of abuse or self-mutilation.  Patient presented as alert and oriented, his thoughts were organized and his memory was intact.  He presented with a depressed mood and he was tearful throughout his assessment.  His affect was flat for the most part.  His eye contact was good and his speech was clear, but he was soft spoken.  Patient was a bit guarded during his assessment process and acted like he has more  things that needed to be said, but he could not bring himself to do it.  Patient was clean and neat in his appearance.  His judgment, insight and impulse control are impaired.  Patient did not appear to be responding to internal stimuli.  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 Depression   . Drug abuse (HCC)     History reviewed. No pertinent surgical history.  Family History: History reviewed. No pertinent family history.  Social History:  reports that he has been smoking. He has never used smokeless tobacco. He reports that he has current or past drug history. Drug: Marijuana. His alcohol history is not on file.  Additional Social History:  Alcohol / Drug Use Pain Medications: see MAR Prescriptions: see MAR Over the Counter: see MAR History of alcohol / drug use?: Yes Longest period of sobriety (when/how long): patient states that he has been clean for six months in the past Negative Consequences of Use: Financial, Personal relationships, Work / School Substance #1 Name of Substance 1: heroin 1 - Age of First Use: 20 1 - Amount (size/oz): 1 gram or more 1 - Frequency: daily 1 - Duration: since onset 1 - Last Use / Amount: 1 gram yesterday  CIWA: CIWA-Ar BP: 113/71  Pulse Rate: 83 COWS:    Allergies: No Known Allergies  Home Medications:  (Not in a hospital admission)  OB/GYN Status:  No LMP for male patient.  General Assessment Data Location of Assessment: [facility] ED TTS Assessment: In system Is this a Tele or Face-to-Face Assessment?: Face-to-Face Is this an Initial Assessment or a Re-assessment for this encounter?: Initial Assessment Patient Accompanied by:: Other(sister) Language Other than English: No Living Arrangements: Other (Comment) What gender do you identify as?: (has a home) Marital status: Single Living Arrangements:  Spouse/significant other Can pt return to current living arrangement?: Yes Admission Status: Voluntary Is patient capable of signing voluntary admission?: Yes Referral Source: Self/Family/Friend Insurance type: (self-pay)     Crisis Care Plan Living Arrangements: Spouse/significant other Legal Guardian: Other:(self) Name of Psychiatrist: none Name of Therapist: none  Education Status Is patient currently in school?: No Is the patient employed, unemployed or receiving disability?: Unemployed  Risk to self with the past 6 months Suicidal Ideation: Yes-Currently Present Has patient been a risk to self within the past 6 months prior to admission? : No Suicidal Intent: Yes-Currently Present Has patient had any suicidal intent within the past 6 months prior to admission? : No Is patient at risk for suicide?: Yes Suicidal Plan?: Yes-Currently Present Has patient had any suicidal plan within the past 6 months prior to admission? : No Specify Current Suicidal Plan: (overdose on heroin) Access to Means: Yes Specify Access to Suicidal Means: (heroin) What has been your use of drugs/alcohol within the last 12 months?: (daily heroin use) Previous Attempts/Gestures: No How many times?: 0 Other Self Harm Risks: (marital and fincial problems) Triggers for Past Attempts: Unknown Intentional Self Injurious Behavior: Cutting Comment - Self Injurious Behavior: (hx of cutting) Family Suicide History: No Recent stressful life event(s): Conflict (Comment) Persecutory voices/beliefs?: No Depression: Yes Depression Symptoms: Despondent, Insomnia, Isolating, Loss of interest in usual pleasures, Feeling worthless/self pity Substance abuse history and/or treatment for substance abuse?: No Suicide prevention information given to non-admitted patients: Not applicable  Risk to Others within the past 6 months Homicidal Ideation: No Does patient have any lifetime risk of violence toward others beyond  the six months prior to admission? : No Thoughts of Harm to Others: No Current Homicidal Intent: No Current Homicidal Plan: No Access to Homicidal Means: No Identified Victim: none History of harm to others?: No Assessment of Violence: None Noted Violent Behavior Description: (none) Does patient have access to weapons?: No Criminal Charges Pending?: No Does patient have a court date: No  Psychosis Hallucinations: None noted Delusions: None noted  Mental Status Report Appearance/Hygiene: Unremarkable Eye Contact: Good Motor Activity: Restlessness Speech: Logical/coherent Level of Consciousness: Restless Mood: Depressed, Anxious, Apathetic Affect: Flat Anxiety Level: Moderate Thought Processes: Coherent Judgement: Unimpaired Orientation: Person, Place, Time, Situation Obsessive Compulsive Thoughts/Behaviors: None     ADLScreening [facility] Assessment Services) Patient's cognitive ability adequate to safely complete daily activities?: Yes Patient able to express need for assistance with ADLs?: Yes Independently performs ADLs?: Yes (appropriate for developmental age)  Prior Inpatient Therapy Prior Inpatient Therapy: No  Prior Outpatient Therapy Prior Outpatient Therapy: No Does patient have an ACCT team?: No Does patient have Intensive In-House Services?  : No Does patient have [facility] services? : No Does patient have P4CC services?: No  ADL Screening (condition at time of admission) Patient's cognitive ability adequate to safely complete daily activities?: Yes Is the patient deaf or have difficulty hearing?: No Does the patient have difficulty seeing, even when wearing  glasses/contacts?: No Does the patient have difficulty concentrating, remembering, or making decisions?: No Patient able to express need for assistance with ADLs?: Yes Does the patient have difficulty dressing or bathing?: No Independently performs ADLs?: Yes (appropriate for developmental age) Does the  patient have difficulty walking or climbing stairs?: No Weakness of Legs: None Weakness of Arms/Hands: None  Home Assistive Devices/Equipment Home Assistive Devices/Equipment: None  Therapy Consults (therapy consults require a physician order) PT Evaluation Needed: No OT Evalulation Needed: No SLP Evaluation Needed: No Abuse/Neglect Assessment (Assessment to be complete while patient is alone) Abuse/Neglect Assessment Can Be Completed: Yes Physical Abuse: Denies Verbal Abuse: Denies Sexual Abuse: Denies Exploitation of patient/patient's resources: Denies Self-Neglect: Denies Values / Beliefs Cultural Requests During Hospitalization: None Spiritual Requests During Hospitalization: None Consults Spiritual Care Consult Needed: No Social Work Consult Needed: No Merchant navy officer (For Healthcare) Does Patient Have a Medical Advance Directive?: No Would patient like information on creating a medical advance directive?: No - Patient declined Nutrition Screen- [facility] Adult/[facility] Has the patient recently lost weight without trying?: Yes, 14-23 lbs. Has the patient been eating poorly because of a decreased appetite?: Yes Malnutrition Screening Tool Score: 3     Disposition:  Per Elta Guadeloupe, NP, Inpatient Treatment is recommended.                                                                                                   Disposition Initial Assessment Completed for this Encounter: Yes Disposition of Patient: Admit Type of inpatient treatment program: Adult  On Site Evaluation by:   Reviewed with Physician:    Arnoldo Lenis Jeryn Bertoni 08/28/2018 5:30 PM

## 2018-08-29 ENCOUNTER — Encounter (HOSPITAL_COMMUNITY): Payer: Self-pay | Admitting: *Deleted

## 2018-08-29 ENCOUNTER — Inpatient Hospital Stay (HOSPITAL_COMMUNITY)
Admission: AD | Admit: 2018-08-29 | Discharge: 2018-09-03 | DRG: 885 | Disposition: A | Discharge status: Home / Self Care | Payer: Medicaid Other | Source: Intra-hospital | Attending: Psychiatry | Admitting: Psychiatry

## 2018-08-29 ENCOUNTER — Other Ambulatory Visit: Payer: Self-pay

## 2018-08-29 DIAGNOSIS — F1120 Opioid dependence, uncomplicated: Secondary | ICD-10-CM | POA: Diagnosis not present

## 2018-08-29 DIAGNOSIS — G40909 Epilepsy, unspecified, not intractable, without status epilepticus: Secondary | ICD-10-CM | POA: Diagnosis present

## 2018-08-29 DIAGNOSIS — Z818 Family history of other mental and behavioral disorders: Secondary | ICD-10-CM | POA: Diagnosis not present

## 2018-08-29 DIAGNOSIS — F1123 Opioid dependence with withdrawal: Secondary | ICD-10-CM | POA: Diagnosis present

## 2018-08-29 DIAGNOSIS — F332 Major depressive disorder, recurrent severe without psychotic features: Secondary | ICD-10-CM | POA: Diagnosis present

## 2018-08-29 DIAGNOSIS — R45851 Suicidal ideations: Secondary | ICD-10-CM | POA: Diagnosis present

## 2018-08-29 DIAGNOSIS — F17210 Nicotine dependence, cigarettes, uncomplicated: Secondary | ICD-10-CM | POA: Diagnosis present

## 2018-08-29 DIAGNOSIS — G4700 Insomnia, unspecified: Secondary | ICD-10-CM | POA: Diagnosis present

## 2018-08-29 DIAGNOSIS — F419 Anxiety disorder, unspecified: Secondary | ICD-10-CM

## 2018-08-29 MED ORDER — NAPROXEN 500 MG PO TABS
500.0000 mg | ORAL_TABLET | Freq: Two times a day (BID) | ORAL | Status: AC | PRN
  Administered 2018-08-29 – 2018-09-01 (×4): 500 mg via ORAL
  Filled 2018-08-29 (×4): qty 1

## 2018-08-29 MED ORDER — CLONIDINE HCL 0.1 MG PO TABS
0.1000 mg | ORAL_TABLET | Freq: Two times a day (BID) | ORAL | Status: AC
  Administered 2018-08-31 – 2018-09-01 (×3): 0.1 mg via ORAL
  Filled 2018-08-29 (×4): qty 1

## 2018-08-29 MED ORDER — CLONIDINE HCL 0.1 MG PO TABS
0.1000 mg | ORAL_TABLET | Freq: Every day | ORAL | Status: DC
  Administered 2018-09-02: 0.1 mg via ORAL
  Filled 2018-08-29 (×2): qty 1

## 2018-08-29 MED ORDER — LOPERAMIDE HCL 2 MG PO CAPS: 2.0000 mg | ORAL_CAPSULE | ORAL | Status: AC | PRN

## 2018-08-29 MED ORDER — ONDANSETRON 4 MG PO TBDP
4.0000 mg | ORAL_TABLET | Freq: Four times a day (QID) | ORAL | Status: AC | PRN
  Administered 2018-08-29 – 2018-08-30 (×3): 4 mg via ORAL
  Filled 2018-08-29 (×3): qty 1

## 2018-08-29 MED ORDER — ACETAMINOPHEN 325 MG PO TABS
650.0000 mg | ORAL_TABLET | Freq: Four times a day (QID) | ORAL | Status: DC | PRN
  Administered 2018-09-02: 650 mg via ORAL
  Filled 2018-08-29: qty 2

## 2018-08-29 MED ORDER — METHOCARBAMOL 500 MG PO TABS
500.0000 mg | ORAL_TABLET | Freq: Three times a day (TID) | ORAL | Status: AC | PRN
  Administered 2018-08-29 – 2018-09-01 (×4): 500 mg via ORAL
  Filled 2018-08-29: qty 1

## 2018-08-29 MED ORDER — DICYCLOMINE HCL 20 MG PO TABS: 20.0000 mg | ORAL_TABLET | Freq: Four times a day (QID) | ORAL | Status: AC | PRN

## 2018-08-29 MED ORDER — HYDROXYZINE HCL 25 MG PO TABS
25.0000 mg | ORAL_TABLET | Freq: Four times a day (QID) | ORAL | Status: AC | PRN
  Administered 2018-08-29 – 2018-09-01 (×6): 25 mg via ORAL
  Filled 2018-08-29 (×3): qty 1

## 2018-08-29 MED ORDER — NICOTINE 21 MG/24HR TD PT24
21.0000 mg | MEDICATED_PATCH | Freq: Every day | TRANSDERMAL | Status: DC
  Administered 2018-08-29 – 2018-08-31 (×3): 21 mg via TRANSDERMAL
  Filled 2018-08-29 (×6): qty 1

## 2018-08-29 MED ORDER — CLONIDINE HCL 0.1 MG PO TABS
0.1000 mg | ORAL_TABLET | Freq: Four times a day (QID) | ORAL | Status: AC
  Administered 2018-08-29 – 2018-08-30 (×4): 0.1 mg via ORAL
  Filled 2018-08-29 (×6): qty 1

## 2018-08-29 MED ORDER — ENSURE ENLIVE PO LIQD
237.0000 mL | Freq: Two times a day (BID) | ORAL | Status: DC
  Administered 2018-08-29 – 2018-08-30 (×2): 237 mL via ORAL

## 2018-08-29 MED ORDER — ALUM & MAG HYDROXIDE-SIMETH 200-200-20 MG/5ML PO SUSP: 30.0000 mL | ORAL | Status: DC | PRN

## 2018-08-29 MED ORDER — MAGNESIUM HYDROXIDE 400 MG/5ML PO SUSP: 30.0000 mL | Freq: Every day | ORAL | Status: DC | PRN

## 2018-08-29 NOTE — Tx Team (Signed)
Initial Treatment Plan 08/29/2018 2:19 PM [name]:[id number]    PATIENT STRESSORS: Financial difficulties Marital or family conflict Occupational concerns Substance abuse   PATIENT STRENGTHS: Ability for insight Average or above average intelligence Capable of independent living General fund of knowledge   PATIENT IDENTIFIED PROBLEMS: Depression Suicidal thoughts Substance abuse "Get off heroin"                     DISCHARGE CRITERIA:  Ability to meet basic life and health needs Improved stabilization in mood, thinking, and/or behavior Verbal commitment to aftercare and medication compliance Withdrawal symptoms are absent or subacute and managed without 24-hour nursing intervention  PRELIMINARY DISCHARGE PLAN: Attend aftercare/continuing care group Return to previous living arrangement  PATIENT/FAMILY INVOLVEMENT: This treatment plan has been presented to and reviewed with the patient, Casey Mcguire, and/or family member, .  The patient and family have been given the opportunity to ask questions and make suggestions.  Anadelia Kintz, [place], [place] 08/29/2018, 2:19 PM

## 2018-08-29 NOTE — ED Notes (Signed)
Pt discharged safely with [place] driver.  All belongings were sent with patient. 

## 2018-08-29 NOTE — ED Notes (Signed)
Bed: WBH34 Expected date:  Expected time:  Means of arrival:  Comments: Hold for 26 

## 2018-08-29 NOTE — Progress Notes (Signed)
Casey Mcguire is a 37 year old male pt admitted on voluntary basis. On admission, [name] appears irritable and does endorse feeling bad and passive SI but able to contract for safety on the unit. He reports that he has been snorting heroin on a daily basis for awhile now and reports last use was on the evening of 10/12. He does endorse withdrawal symptoms and reports that he has gone through this before. He also reports some marijuana usage but denies any other substance abuse issue. He reports that he is not on any medications currently. He reports that he lives with his girlfriend and children and reports that he will return there after discharge. Joniel was escorted to the unit, oriented to the milieu and safety maintained.

## 2018-08-29 NOTE — ED Notes (Signed)
Pt belongings locked up in locker #34. Belongings: Wallace Cullens shirt, jeans, green belt, tan jacket, pall mall cigarettes, 1 lighter, $3.00 cash, white sneakers, black bluetooth head phones, black smart phone with crack screen in black case, [place] DL, Debit Mastercard, Blue bag with strings- misc items inside- bag not opened. Belongings were itemized on belongings sheet by this writer and placed in pt chart at nurses station. Pt signed belongings sheet acknowledging his understanding that belongings would remain locked up until discharge.

## 2018-08-29 NOTE — BH Assessment (Signed)
[facility] Assessment Progress Note  Per Nelly Rout, MD, this pt requires psychiatric hospitalization at this time.  Malva Limes, RN, [facility] has [facility] pt to [facility] Rm 302-2; [facility] will be ready to receive pt at 13:00.  Pt has signed Voluntary Admission and Consent for Treatment, as well as Consent to Release Information to no one, and signed forms have been faxed to [facility].  Pt's nurse, Kendal Hymen, has been notified, and agrees to send original paperwork along with pt via Juel Burrow, and to call report to [phone].  Doylene Canning, [place] [facility]al Health Coordinator [phone]

## 2018-08-29 NOTE — Consult Note (Signed)
[facility] Consult   Reason for Consult:  Suicidal ideations Referring Physician: ED MD Patient Identification: Casey Mcguire MRN:  [id number] Principal Diagnosis: Major depressive disorder, recurrent severe without psychotic features [facility]) Diagnosis:   Patient Active Problem List   Diagnosis Date Noted  . Major depressive disorder, recurrent severe without psychotic features ([place]) [F33.2] 08/29/2018    Priority: High  . Opiate dependence, continuous ([place]) [F11.20] 08/29/2018    Priority: High  . Status epilepticus ([place]) [G40.901]   . Altered mental status [R41.82] 07/01/2015  . Acute respiratory failure with hypoxia ([place]) [J96.01] 07/01/2015  . Accidental heroin overdose ([place]) [T40.1X1A] 07/01/2015  . Encephalopathy acute [G93.40] 07/01/2015    Total Time spent with patient: 45 minutes  Subjective:   Casey Mcguire is a 37 y.o. male patient admitted with suicidal ideation with a plan to overdose on heroin. Pt states he has been using heroin for 6 years and has progressively gotten worse were he is unable to work.  Pt states" I want to die and I plan to overdose on heroin.  Pt states his girlfriend kicked him out due to his drug use. Pt states he has overdosed  3 times in the past.  Pt states he has been clean in past for 6 months on his own but is unable to do it on his own now.  Pt states he feels he has suffered from depression for a long time and has never gotten help. He states his anxiety is getting worse. Pt complains of w/d symptoms of insomnia, nausea sweating, restlessness and "feeling uncomfortable".  HPI:  Mr. [name] is a 37 yo male who presented to [facility] voluntarily due to increasing suicidal ideation with a plan to overdose on heroin.  Pt states that he has suffered from depression for a long time and is self medicating with heroin.  Pt states he using heroin to self-medicate for his depression which is adding to his problems. He is currently using one gram a  day. Pt has not had a job in a year due to his depression.  Pt has been using for the last 6 to 8 years.  He snorts his heroin.  Denies IV use. Pt currently has no place to live due to his drug use.  His girlfriend wants him to get clean. He has 3 young children 12, 2 and 1.   Stress increases his use, lack of money decreases his use.  Uses less in the morning.  Pt denies any previous psychiatric or substance abuse treatment. Pt has a family history positive for bipolar disorder, schizophrenia and depression.    On evaluation today: Pt is alert and orientated but continues to have suicidal ideation.  Pt is feeling hopeless and sad.  Pt is tearful during assessment.  Pt has poor eye contact and has a flat affect. Patient denied homicidal ideation.  Pt denies auditory or visual hallucinations.  Pt does want to obtain help and verbalizes that he needs treatment for his depression and substance abuse.    Past Psychiatric History: Denies any past treatment or hospitalizations.   Denies any counseling in the past.  Risk to Self: Suicidal Ideation: Yes-Currently Present Suicidal Intent: Yes-Currently Present Is patient at risk for suicide?: Yes Suicidal Plan?: Yes-Currently Present Specify Current Suicidal Plan: (overdose on heroin) Access to Means: Yes Specify Access to Suicidal Means: (heroin) What has been your use of drugs/alcohol within the last 12 months?: (daily heroin use) How many times?: 0 Other Self  Harm Risks: (marital and fincial problems) Triggers for Past Attempts: Unknown Intentional Self Injurious Behavior: Cutting Comment - Self Injurious Behavior: (hx of cutting) Risk to Others: Homicidal Ideation: No Thoughts of Harm to Others: No Current Homicidal Intent: No Current Homicidal Plan: No Access to Homicidal Means: No Identified Victim: none History of harm to others?: No Assessment of Violence: None Noted Violent Behavior Description: (none) Does patient have access to  weapons?: No Criminal Charges Pending?: No Does patient have a court date: No Prior Inpatient Therapy: Prior Inpatient Therapy: No Prior Outpatient Therapy: Prior Outpatient Therapy: No Does patient have an ACCT team?: No Does patient have Intensive In-House Services?  : No Does patient have [facility] services? : No Does patient have P4CC services?: No  Past Medical History:  Past Medical History:  Diagnosis Date  . Depression   . Drug abuse ([place])    History reviewed. No pertinent surgical history. Family History: History reviewed. No pertinent family history. Family Psychiatric  History: Mom and Dad depression.  Sister bipolar, brother schizophrenia Social History:  Social History   Substance and Sexual Activity  Alcohol Use Not on file     Social History   Substance and Sexual Activity  Drug Use Yes  . Types: Marijuana   Comment: heroin    Social History   Socioeconomic History  . Marital status: Single    Spouse name: Not on file  . Number of children: Not on file  . Years of education: Not on file  . Highest education level: Not on file  Occupational History  . Not on file  Social Needs  . Financial resource strain: Not on file  . Food insecurity:    Worry: Not on file    Inability: Not on file  . Transportation needs:    Medical: Not on file    Non-medical: Not on file  Tobacco Use  . Smoking status: Current Every Day Smoker  . Smokeless tobacco: Never Used  Substance and Sexual Activity  . Alcohol use: Not on file  . Drug use: Yes    Types: Marijuana    Comment: heroin  . Sexual activity: Not on file  Lifestyle  . Physical activity:    Days per week: Not on file    Minutes per session: Not on file  . Stress: Not on file  Relationships  . Social connections:    Talks on phone: Not on file    Gets together: Not on file    Attends religious service: Not on file    Active member of club or organization: Not on file    Attends meetings of clubs or  organizations: Not on file    Relationship status: Not on file  Other Topics Concern  . Not on file  Social History Narrative  . Not on file   Additional Social History:    Allergies:  No Known Allergies  Labs:  Results for orders placed or performed during the hospital encounter of 08/28/18 (from the past 48 hour(s))  Rapid urine drug screen (hospital performed)     Status: Abnormal   Collection Time: 08/28/18  3:48 PM  Result Value Ref Range   Opiates POSITIVE (A) NONE DETECTED   Cocaine NONE DETECTED NONE DETECTED   Benzodiazepines NONE DETECTED NONE DETECTED   Amphetamines NONE DETECTED NONE DETECTED   Tetrahydrocannabinol POSITIVE (A) NONE DETECTED   Barbiturates NONE DETECTED NONE DETECTED    Comment: (NOTE) DRUG SCREEN FOR MEDICAL PURPOSES ONLY.  IF CONFIRMATION  IS NEEDED FOR ANY PURPOSE, NOTIFY LAB WITHIN 5 DAYS. LOWEST DETECTABLE LIMITS FOR URINE DRUG SCREEN Drug Class                     Cutoff (ng/mL) Amphetamine and metabolites    1000 Barbiturate and metabolites    200 Benzodiazepine                 937 Tricyclics and metabolites     300 Opiates and metabolites        300 Cocaine and metabolites        300 THC                            50 Performed at [facility], [place] [address], [place], [place] [address]   Comprehensive metabolic panel     Status: Abnormal   Collection Time: 08/28/18  4:08 PM  Result Value Ref Range   Sodium 142 135 - 145 mmol/L   Potassium 4.1 3.5 - 5.1 mmol/L   Chloride 104 98 - 111 mmol/L   CO2 31 22 - 32 mmol/L   Glucose, Bld 104 (H) 70 - 99 mg/dL   BUN 9 6 - 20 mg/dL   Creatinine, Ser 0.73 0.61 - 1.24 mg/dL   Calcium 8.7 (L) 8.9 - 10.3 mg/dL   Total Protein 6.4 (L) 6.5 - 8.1 g/dL   Albumin 3.9 3.5 - 5.0 g/dL   AST 17 15 - 41 U/L   ALT 8 0 - 44 U/L   Alkaline Phosphatase 39 38 - 126 U/L   Total Bilirubin 0.5 0.3 - 1.2 mg/dL   GFR calc non Af Amer >60 >60 mL/min   GFR calc Af Amer >60 >60 mL/min     Comment: (NOTE) The eGFR has been calculated using the CKD EPI equation. This calculation has not been validated in all clinical situations. eGFR's persistently <60 mL/min signify possible Chronic Kidney Disease.    Anion gap 7 5 - 15    Comment: Performed at [facility], [place] [address], [place], [place] [address]  cbc     Status: None   Collection Time: 08/28/18  4:08 PM  Result Value Ref Range   WBC 9.1 4.0 - 10.5 K/uL   RBC 4.53 4.22 - 5.81 MIL/uL   Hemoglobin 14.1 13.0 - 17.0 g/dL   HCT 43.4 39.0 - 52.0 %   MCV 95.8 80.0 - 100.0 fL   MCH 31.1 26.0 - 34.0 pg   MCHC 32.5 30.0 - 36.0 g/dL   RDW 12.6 11.5 - 15.5 %   Platelets 225 150 - 400 K/uL   nRBC 0.0 0.0 - 0.2 %    Comment: Performed at [facility], [place] [address] [place], [place] [address]  Ethanol     Status: None   Collection Time: 08/28/18  4:09 PM  Result Value Ref Range   Alcohol, Ethyl (B) <10 <10 mg/dL    Comment: (NOTE) Lowest detectable limit for serum alcohol is 10 mg/dL. For medical purposes only. Performed at [facility], [place] [address], [place], [place] [address]   Salicylate level     Status: None   Collection Time: 08/28/18  4:09 PM  Result Value Ref Range   Salicylate Lvl <1.9 2.8 - 30.0 mg/dL    Comment: Performed at [facility], [place] [address], [place], [place] [address]  Acetaminophen level  Status: None   Collection Time: 08/28/18  4:09 PM  Result Value Ref Range   Acetaminophen (Tylenol), Serum 16 10 - 30 ug/mL    Comment: (NOTE) Therapeutic concentrations vary significantly. A range of 10-30 ug/mL  may be an effective concentration for many patients. However, some  are best treated at concentrations outside of this range. Acetaminophen concentrations >150 ug/mL at 4 hours after ingestion  and >50 ug/mL at 12 hours after ingestion are often associated with  toxic reactions. Performed at [facility], [place] [address], [place], [place] [address]     Current Facility-Administered Medications  Medication Dose Route Frequency Provider Last Rate Last Dose  . cloNIDine (CATAPRES) tablet 0.1 mg  0.1 mg Oral QID Lacretia Leigh, MD   0.1 mg at 08/28/18 2355   Followed by  . [START ON 08/30/2018] cloNIDine (CATAPRES) tablet 0.1 mg  0.1 mg Oral BID Lacretia Leigh, MD       Followed by  . [START ON 09/02/2018] cloNIDine (CATAPRES) tablet 0.1 mg  0.1 mg Oral Daily Lacretia Leigh, MD      . dicyclomine (BENTYL) tablet 20 mg  20 mg Oral Q6H PRN Lacretia Leigh, MD      . hydrOXYzine (ATARAX/VISTARIL) tablet 25 mg  25 mg Oral Q6H PRN Lacretia Leigh, MD      . loperamide (IMODIUM) capsule 2-4 mg  2-4 mg Oral PRN Lacretia Leigh, MD      . methocarbamol (ROBAXIN) tablet 500 mg  500 mg Oral Q8H PRN Lacretia Leigh, MD      . naproxen (NAPROSYN) tablet 500 mg  500 mg Oral BID PRN Lacretia Leigh, MD      . ondansetron (ZOFRAN-ODT) disintegrating tablet 4 mg  4 mg Oral Q6H PRN Lacretia Leigh, MD       Current Outpatient Medications  Medication Sig Dispense Refill  . acetaminophen (TYLENOL) 325 MG tablet Take 650 mg by mouth every 6 (six) hours as needed for moderate pain.    . clindamycin (CLEOCIN) 300 MG capsule Take 1 capsule (300 mg total) by mouth 3 (three) times daily. (Patient not taking: Reported on 08/28/2018) 30 capsule 0  . naproxen (NAPROSYN) 500 MG tablet Take 1 tablet (500 mg total) by mouth 2 (two) times daily. (Patient not taking: Reported on 08/28/2018) 30 tablet 0    Musculoskeletal: Strength & Muscle Tone: within normal limits Gait & Station: normal Patient leans: N/A  Psychiatric Specialty Exam: Physical Exam  Constitutional: He is oriented to person, place, and time. He appears well-developed and well-nourished.  HENT:  Head: Normocephalic.  Neck: Normal range of motion.  Cardiovascular: Normal rate.  Respiratory: Effort normal.  Musculoskeletal: Normal range of motion.   Neurological: He is alert and oriented to person, place, and time.  Psychiatric: His speech is normal. He is withdrawn. Cognition and memory are normal. He expresses inappropriate judgment. He exhibits a depressed mood. He expresses suicidal ideation. He expresses suicidal plans.    Review of Systems  Constitutional: Negative.   HENT: Negative.   Eyes: Negative.   Respiratory: Negative.   Gastrointestinal: Positive for nausea.  Musculoskeletal: Positive for myalgias.  Skin: Negative.   Neurological: Negative.   Endo/Heme/Allergies: Negative.   Psychiatric/Behavioral: Positive for depression and suicidal ideas. The patient is nervous/anxious.     Blood pressure 111/66, pulse (!) 47, temperature 98 F (36.7 C), temperature source Oral, resp. rate 16, SpO2 97 %.There is no height or weight on file to calculate BMI.  General Appearance:  Casual and Neat  Eye Contact:  Poor  Speech:  Clear and Coherent  Volume:  Decreased  Mood:  Depressed  Affect:  Depressed, Flat and Tearful  Thought Process:  Coherent  Orientation:  Full (Time, Place, and Person)  Thought Content:  Logical  Suicidal Thoughts:  Yes.  with intent/plan  Homicidal Thoughts:  No  Memory:  Immediate;   Good Recent;   Good Remote;   Good  Judgement:  Impaired  Insight:  Fair  Psychomotor Activity:  Restlessness  Concentration:  Concentration: Fair  Recall:  Good  Fund of Knowledge:  Good  Language:  Good  Akathisia:  No  Handed:  Right  AIMS (if indicated):     Assets:  Communication Skills Desire for Improvement Physical Health Resilience  ADL's:  Intact  Cognition:  WNL  Sleep:   3     Treatment Plan Summary:  Major depressive disorder, recurrent, severe without psychosis: Clonidine opiate withdrawal protocol in place Plan inpatient admission  Disposition: Recommend psychiatric Inpatient admission when medically cleared.  Waylan Boga, NP 08/29/2018 9:36 AM

## 2018-08-29 NOTE — ED Notes (Signed)
Pt oriented to room and unit.  Pt denies S/I, H/I, and AVH.  Pt is somewhat withdrawn.  Pt shows no obvious signs of withdrawal at this time.  15 minute checks and video monitoring in place.

## 2018-08-30 DIAGNOSIS — F17210 Nicotine dependence, cigarettes, uncomplicated: Secondary | ICD-10-CM

## 2018-08-30 DIAGNOSIS — F1120 Opioid dependence, uncomplicated: Secondary | ICD-10-CM

## 2018-08-30 DIAGNOSIS — F419 Anxiety disorder, unspecified: Secondary | ICD-10-CM

## 2018-08-30 DIAGNOSIS — R45851 Suicidal ideations: Secondary | ICD-10-CM

## 2018-08-30 DIAGNOSIS — F332 Major depressive disorder, recurrent severe without psychotic features: Principal | ICD-10-CM

## 2018-08-30 MED ORDER — SERTRALINE HCL 50 MG PO TABS
50.0000 mg | ORAL_TABLET | Freq: Every day | ORAL | Status: DC
  Administered 2018-08-30 – 2018-09-02 (×4): 50 mg via ORAL
  Filled 2018-08-30: qty 1
  Filled 2018-08-30: qty 7
  Filled 2018-08-30 (×6): qty 1

## 2018-08-30 NOTE — Plan of Care (Signed)
Patient oriented to the unit. Patient remains safe and will continue to monitor.   Problem: Education: Goal: Knowledge of [facility] Education information/materials will improve Outcome: Progressing   Problem: Safety: Goal: Periods of time without injury will increase Outcome: Progressing

## 2018-08-30 NOTE — B[facility] Counselor (Signed)
Adult Comprehensive Assessment  Patient ID: Casey Mcguire, male   DOB: [birth year], 37 y.o.   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 significant financial strain Housing / Lack of housing: lives with long time girlfriend and their 3 children Physical health (include injuries & life threatening diseases): none identified other than experiencing severe withdrawals Social relationships: poor-girlfriend is primary support Substance abuse: heroin one gram daily-snorting. no other substance abuse reported. hx marijuana use. pt reports he relapsed several weeks ago on heroin after six months of sobriety Bereavement / Loss: none reported.   Living/Environment/Situation:  Living Arrangements: Spouse/significant other, Children Living conditions (as described by patient or guardian): home Who else lives in the home?: girlfriend and their 3 kids  How long has patient lived in current situation?: several years What is atmosphere in current home: Comfortable, Paramedic  Family History:  Marital status: Long term relationship Long term relationship, how long?: 12 years What types of issues is patient dealing with in the relationship?: pt's girlfriend is upset with him due to recent relapse.  Additional relationship information: she is hoping for him to get detoxed. Are you sexually active?: Yes What is your sexual orientation?: heterosexual Has your  sexual activity been affected by drugs, alcohol, medication, or emotional stress?: n/a Does patient have children?: Yes How many children?: 3 How is patient's relationship with their children?: 3 mo, 2yo, 92 yo children. "close to all of them."  Childhood History:  By whom was/is the patient raised?: Both parents Additional childhood history information: parents raised pt. does not report that parents suffered from mental illness/substance abuse Description of patient's relationship with caregiver when they were a child: close to parents Patient's description of current relationship with people who raised him/her: declined to answer.  How were you disciplined when you got in trouble as a child/adolescent?: grounded Does patient have siblings?: No Did patient suffer any verbal/emotional/physical/sexual abuse as a child?: No Did patient suffer from severe childhood neglect?: No Has patient ever been sexually abused/assaulted/raped as an adolescent or adult?: No Was the patient ever a victim of a crime or a disaster?: No Witnessed domestic violence?: No Has patient been effected by domestic violence as an adult?: No  Education:  Highest grade of school patient has completed: GED Currently a Consulting civil engineer?: No Learning disability?: No  Employment/Work Situation:   Employment situation: Employed 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?: No(n/a) Are There Guns or Other Weapons in Your Home?: No Are These Weapons Safely Secured?: (n/a)  Financial Resources:   Financial resources: Income  from employment, Food stamps Does patient have a representative payee or guardian?: No  Alcohol/Substance  Abuse:   What has been your use of drugs/alcohol within the last 12 months?: heroin (snorting) for past several weeks after relapsing. prior to this, pt reports 6 mo clean. history of some marijuana use but not recently.  If attempted suicide, did drugs/alcohol play a role in this?: Yes(SI thoughts recently) Alcohol/Substance Abuse Treatment Hx: Denies past history, Attends AA/NA If yes, describe treatment: not currently active in NA. history of NA.  Has alcohol/substance abuse ever caused legal problems?: No  Social Support System:   Forensic psychologist System: Poor Describe Community Support System: girlfriend is only identified support  Type of faith/religion: none How does patient's faith help to cope with current illness?: n/a   Leisure/Recreation:   Leisure and Hobbies: "nothing." pt reports low energy and low motivation lately.   Strengths/Needs:   What is the patient's perception of their strengths?: "I don't know." Patient states they can use these personal strengths during their treatment to contribute to their recovery: "I don't know." Patient states these barriers may affect/interfere with their treatment: none identified Patient states these barriers may affect their return to the community: conflict with girlfriend Other important information patient would like considered in planning for their treatment: unsure if he can return home. also not willing to discuss treatment options at this time.   Discharge Plan:   Currently receiving community mental health services: No Patient states concerns and preferences for aftercare planning are: not willing to discuss aftercare currently Patient states they will know when they are safe and ready for discharge when: "when I'm detoxed." Does patient have access to transportation?: Yes(bus or girlfriend) Does patient have  financial barriers related to discharge medications?: Yes Patient description of barriers related to discharge medications: limited income; no insurance.  Will patient be returning to same living situation after discharge?: Yes(pt thinks he can return home with family at discharge. CSW encouraged pt to communicate with family to cofirm this, as pt is not willing to consent to collateral contact with family by CSW.)  Summary/Recommendations:   Summary and Recommendations (to be completed by the evaluator): Patient is 37yo male living in [place], [place] [facility] [place]) with his girlfriend and three chidren. He presents to the hospital seeking treatment for SI, depression, heroin abuse/detox, and for medication stabilization. Pt is employed part time and reports recently relapsing on heroin after approximately 6 months clean. Pt denies SI/HI/AVH. He has a diagnosis of MDD and opiate use disorder, severe. Recommendations for pt include: crisis stabilization, therapeutic milieu, encourage group attendance and participation, medication management for detox/mood stabilization, and development of comprehensive mental wellness/sobriety plan. CSW assessing for appropriate referrals.   Rona Ravens LCSW 08/30/2018 11:17 AM

## 2018-08-30 NOTE — Progress Notes (Signed)
D: Patient is oriented and cooperative. Denies SI, HI, AVH, and verbally contracts for safety. Patient reports nausea, runny nose, and joint/muscle aches all over.   A: Scheduled medications administered per MD order. Support provided. Patient educated on safety on the unit and medications. Routine safety checks every 15 minutes. Patient stated understanding to tell nurse about any new physical symptoms. Patient understands to tell staff of any needs.     R: No adverse drug reactions noted. Patient verbally contracts for safety. Patient remains safe at this time and will continue to monitor.

## 2018-08-30 NOTE — Progress Notes (Signed)
[name]:  (Nursing/MHT/Case Management/Adjunct)  Date:  08/30/2018  Time:  2045  Type of Therapy:  wrap up group  Participation Level:  Active  Participation Quality:  Appropriate, Attentive, Sharing and Supportive  Affect:  Anxious  Cognitive:  Appropriate  Insight:  Improving  Engagement in Group:  Engaged  Modes of Intervention:  Clarification, Education and Support  Summary of Progress/Problems: Pt shares that he was happy he got to talk to his kids especially the almost two year old today. Pt is grateful for this staff and other patients he has met here.  Pt reports feeling anxious and is anxious to get home but knows he should spend a few days here to work with the Education officer, museum.  If patient could change any one thing about his life pt reported drugs.   Casey Mcguire S 08/30/2018, 9:00 PM

## 2018-08-30 NOTE — Progress Notes (Signed)
Nursing Progress Note: 7p-7a D: Pt currently presents with a anxious/pleasant affect and behavior. Pt states "I really am trying to be more positive." Interacting appropriately with the milieu. Pt reports good sleep during the previous night with current medication regimen. Pt did attend wrap-up group.  A: Pt provided with medications per providers orders. Pt's labs and vitals were monitored throughout the night. Pt supported emotionally and encouraged to express concerns and questions. Pt educated on medications.  R: Pt's safety ensured with 15 minute and environmental checks. Pt currently denies SI, HI, and AVH. Pt verbally contracts to seek staff if SI,HI, or AVH occurs and to consult with staff before acting on any harmful thoughts. Will continue to monitor.

## 2018-08-30 NOTE — H&P (Signed)
Psychiatric Admission Assessment Adult  Patient Identification: Casey Mcguire MRN:  [id number] Date of Evaluation:  08/30/2018 Chief Complaint:  MDD Opiate Use Disorder Principal Diagnosis: Opioid use disorder, severe, dependence ([place]) Diagnosis:   Patient Active Problem List   Diagnosis Date Noted  . Major depressive disorder, recurrent severe without psychotic features ([place]) [F33.2] 08/29/2018  . Opioid use disorder, severe, dependence ([place]) [F11.20] 08/29/2018  . Status epilepticus ([place]) [G40.901]   . Altered mental status [R41.82] 07/01/2015  . Acute respiratory failure with hypoxia ([place]) [J96.01] 07/01/2015  . Accidental heroin overdose ([place]) [T40.1X1A] 07/01/2015  . Encephalopathy acute [G93.40] 07/01/2015   History of Present Illness:   Casey Mcguire is a 37 y/o M without formal psychiatric history who was admitted from [facility] voluntarily with worsening depression, SI with plan to overdose on heroin, and worsening use of heroin. Pt was medically cleared and then transferred to [facility] for additional treatment and stabilization. He was started on COWS with clonidine for opiate withdrawal.  Upon initial interview, pt shares, "I had relapsed. It got real bad." Pt details that he relapsed on heroin about 6 months ago after a previous period of 6 months of sobriety. He has been using about 1 gram/day via insufflation. He describes that his mood has been worsening and also he has stressors of strained relationship with his girlfriend and financial strain. He endorses depression symptoms of initial insomnia (average of about 2-4 hours of sleep per night), anhedonia, guilty feelings, low energy, poor concentration, and poor appetite (loss of 20 lbs in about 2 months). Pt had SI with plan to overdose on heroin prior to admission, and he reports that he still has some mild SI but he is able to contract for safety while in the hospital. He denies HI/AH/VH. He denies symptoms of mania, OCD, and PTSD.  He has been using about 1 gram of heroin daily via insufflation, and he smokes tobacco 1 ppd. He denies other illicit substance use.  Discussed with patient about treatment options. He has already been started on COWS with clonidine, and we will continue that detox protocol. Pt has no previous psychotropic medication trials. He is in agreement to trial of zoloft. He is unsure about pursuing treatment after detox, but he is willing to discuss options with the SW team. Pt was in agreement with the above plan, and he had no further questions, comments, or concerns.  Associated Signs/Symptoms: Depression Symptoms:  depressed mood, anhedonia, insomnia, fatigue, feelings of worthlessness/guilt, difficulty concentrating, hopelessness, suicidal thoughts with specific plan, anxiety, loss of energy/fatigue, (Hypo) Manic Symptoms:  Irritable Mood, Anxiety Symptoms:  Excessive Worry, Psychotic Symptoms:  NA PTSD Symptoms: NA Total Time spent with patient: 1 hour  Past Psychiatric History:   -no previous formal diagnoses - no previous inpatient treatment - no previous outpatient treatment - no history of suicide attempts  Is the patient at risk to self? Yes.    Has the patient been a risk to self in the past 6 months? Yes.    Has the patient been a risk to self within the distant past? Yes.    Is the patient a risk to others? Yes.    Has the patient been a risk to others in the past 6 months? Yes.    Has the patient been a risk to others within the distant past? Yes.     Prior Inpatient Therapy:   Prior Outpatient Therapy:    Alcohol Screening: 1. How often do you have a drink  containing alcohol?: Monthly or less 2. How many drinks containing alcohol do you have on a typical day when you are drinking?: 1 or 2 3. How often do you have six or more drinks on one occasion?: Never AUDIT-C Score: 1 4. How often during the last year have you found that you were not able to stop drinking once  you had started?: Never 5. How often during the last year have you failed to do what was normally expected from you becasue of drinking?: Never 6. How often during the last year have you needed a first drink in the morning to get yourself going after a heavy drinking session?: Never 7. How often during the last year have you had a feeling of guilt of remorse after drinking?: Never 8. How often during the last year have you been unable to remember what happened the night before because you had been drinking?: Never 9. Have you or someone else been injured as a result of your drinking?: No 10. Has a relative or friend or a doctor or another health worker been concerned about your drinking or suggested you cut down?: No Alcohol Use Disorder Identification Test Final Score (AUDIT): 1 Intervention/Follow-up: AUDIT Score <7 follow-up not indicated Substance Abuse History in the last 12 months:  Yes.   Consequences of Substance Abuse: Medical Consequences:  worsened mood symptoms Previous Psychotropic Medications: No  Psychological Evaluations: No  Past Medical History:  Past Medical History:  Diagnosis Date  . Depression   . Drug abuse ([place])    History reviewed. No pertinent surgical history. Family History: History reviewed. No pertinent family history. Family Psychiatric  History: sister hx of bipolar disorder, brother hx of schizophrenia, mother history of depression and anxiety. No family history of suicide attempt/completion. Tobacco Screening: Have you used any form of tobacco in the last 30 days? (Cigarettes, Smokeless Tobacco, Cigars, and/or Pipes): Yes Tobacco use, Select all that apply: 5 or more cigarettes per day Are you interested in Tobacco Cessation Medications?: Yes, will notify MD for an order Counseled patient on smoking cessation including recognizing danger situations, developing coping skills and basic information about quitting provided: Refused/Declined practical  counseling Social History: Pt was born and raised in [place]. He currently lives with his girlfriend and 3 children (ages 76, 2, and 3 months). He completed his GED. He works in Architect off and on. He denies legal and trauma history.  Social History   Substance and Sexual Activity  Alcohol Use Not Currently  . Alcohol/week: 0.0 standard drinks     Social History   Substance and Sexual Activity  Drug Use Yes  . Types: Marijuana, Heroin   Comment: heroin    Additional Social History: Marital status: Long term relationship Long term relationship, how long?: 12 years What types of issues is patient dealing with in the relationship?: pt's girlfriend is upset with him due to recent relapse.  Additional relationship information: she is hoping for him to get detoxed. Are you sexually active?: Yes What is your sexual orientation?: heterosexual Has your sexual activity been affected by drugs, alcohol, medication, or emotional stress?: n/a Does patient have children?: Yes How many children?: 3 How is patient's relationship with their children?: 3 mo, 2yo, 22 yo children. "close to all of them."                         Allergies:  No Known Allergies Lab Results:  Results for orders placed or performed  during the hospital encounter of 08/28/18 (from the past 48 hour(s))  Rapid urine drug screen (hospital performed)     Status: Abnormal   Collection Time: 08/28/18  3:48 PM  Result Value Ref Range   Opiates POSITIVE (A) NONE DETECTED   Cocaine NONE DETECTED NONE DETECTED   Benzodiazepines NONE DETECTED NONE DETECTED   Amphetamines NONE DETECTED NONE DETECTED   Tetrahydrocannabinol POSITIVE (A) NONE DETECTED   Barbiturates NONE DETECTED NONE DETECTED    Comment: (NOTE) DRUG SCREEN FOR MEDICAL PURPOSES ONLY.  IF CONFIRMATION IS NEEDED FOR ANY PURPOSE, NOTIFY LAB WITHIN 5 DAYS. LOWEST DETECTABLE LIMITS FOR URINE DRUG SCREEN Drug Class                     Cutoff  (ng/mL) Amphetamine and metabolites    1000 Barbiturate and metabolites    200 Benzodiazepine                 350 Tricyclics and metabolites     300 Opiates and metabolites        300 Cocaine and metabolites        300 THC                            50 Performed at [facility], [place] [address], [place], [place] [address]   Comprehensive metabolic panel     Status: Abnormal   Collection Time: 08/28/18  4:08 PM  Result Value Ref Range   Sodium 142 135 - 145 mmol/L   Potassium 4.1 3.5 - 5.1 mmol/L   Chloride 104 98 - 111 mmol/L   CO2 31 22 - 32 mmol/L   Glucose, Bld 104 (H) 70 - 99 mg/dL   BUN 9 6 - 20 mg/dL   Creatinine, Ser 0.73 0.61 - 1.24 mg/dL   Calcium 8.7 (L) 8.9 - 10.3 mg/dL   Total Protein 6.4 (L) 6.5 - 8.1 g/dL   Albumin 3.9 3.5 - 5.0 g/dL   AST 17 15 - 41 U/L   ALT 8 0 - 44 U/L   Alkaline Phosphatase 39 38 - 126 U/L   Total Bilirubin 0.5 0.3 - 1.2 mg/dL   GFR calc non Af Amer >60 >60 mL/min   GFR calc Af Amer >60 >60 mL/min    Comment: (NOTE) The eGFR has been calculated using the CKD EPI equation. This calculation has not been validated in all clinical situations. eGFR's persistently <60 mL/min signify possible Chronic Kidney Disease.    Anion gap 7 5 - 15    Comment: Performed at [facility], [place] [address], [place], [place] [address]  cbc     Status: None   Collection Time: 08/28/18  4:08 PM  Result Value Ref Range   WBC 9.1 4.0 - 10.5 K/uL   RBC 4.53 4.22 - 5.81 MIL/uL   Hemoglobin 14.1 13.0 - 17.0 g/dL   HCT 43.4 39.0 - 52.0 %   MCV 95.8 80.0 - 100.0 fL   MCH 31.1 26.0 - 34.0 pg   MCHC 32.5 30.0 - 36.0 g/dL   RDW 12.6 11.5 - 15.5 %   Platelets 225 150 - 400 K/uL   nRBC 0.0 0.0 - 0.2 %    Comment: Performed at [facility], [place] [address] [place],  [address]  Ethanol     Status: None   Collection Time: 08/28/18  4:09 PM  Result Value Ref  Range   Alcohol, Ethyl (B) <10 <10 mg/dL     Comment: (NOTE) Lowest detectable limit for serum alcohol is 10 mg/dL. For medical purposes only. Performed at [facility], [place] [address], [place], [place] [address]   Salicylate level     Status: None   Collection Time: 08/28/18  4:09 PM  Result Value Ref Range   Salicylate Lvl <3.1 2.8 - 30.0 mg/dL    Comment: Performed at [facility], [place] [address] [place], [place] [address]  Acetaminophen level     Status: None   Collection Time: 08/28/18  4:09 PM  Result Value Ref Range   Acetaminophen (Tylenol), Serum 16 10 - 30 ug/mL    Comment: (NOTE) Therapeutic concentrations vary significantly. A range of 10-30 ug/mL  may be an effective concentration for many patients. However, some  are best treated at concentrations outside of this range. Acetaminophen concentrations >150 ug/mL at 4 hours after ingestion  and >50 ug/mL at 12 hours after ingestion are often associated with  toxic reactions. Performed at [facility], [place] [address], [place], [place] [address]     Blood Alcohol level:  Lab Results  Component Value Date   ETH <10 08/28/2018   ETH <5 50/93/2671    Metabolic Disorder Labs:  No results found for: HGBA1C, MPG No results found for: PROLACTIN Lab Results  Component Value Date   TRIG 77 07/01/2015    Current Medications: Current Facility-Administered Medications  Medication Dose Route Frequency Provider Last Rate Last Dose  . acetaminophen (TYLENOL) tablet 650 mg  650 mg Oral Q6H PRN Patrecia Pour, NP      . alum & mag hydroxide-simeth (MAALOX/MYLANTA) 200-200-20 MG/5ML suspension 30 mL  30 mL Oral Q4H PRN Patrecia Pour, NP      . cloNIDine (CATAPRES) tablet 0.1 mg  0.1 mg Oral QID Patrecia Pour, NP   0.1 mg at 08/30/18 0809   Followed by  . [START ON 08/31/2018] cloNIDine (CATAPRES) tablet 0.1 mg  0.1 mg Oral BID Patrecia Pour, NP       Followed by  . [START ON 09/02/2018] cloNIDine  (CATAPRES) tablet 0.1 mg  0.1 mg Oral Daily Lord, Jamison Y, NP      . dicyclomine (BENTYL) tablet 20 mg  20 mg Oral Q6H PRN Patrecia Pour, NP      . feeding supplement (ENSURE ENLIVE) (ENSURE ENLIVE) liquid 237 mL  237 mL Oral BID BM Sharma Covert, MD   237 mL at 08/30/18 0810  . hydrOXYzine (ATARAX/VISTARIL) tablet 25 mg  25 mg Oral Q6H PRN Patrecia Pour, NP   25 mg at 08/30/18 1316  . loperamide (IMODIUM) capsule 2-4 mg  2-4 mg Oral PRN Patrecia Pour, NP      . magnesium hydroxide (MILK OF MAGNESIA) suspension 30 mL  30 mL Oral Daily PRN Patrecia Pour, NP      . methocarbamol (ROBAXIN) tablet 500 mg  500 mg Oral Q8H PRN Patrecia Pour, NP   500 mg at 08/29/18 2036  . naproxen (NAPROSYN) tablet 500 mg  500 mg Oral BID PRN Patrecia Pour, NP   500 mg at 08/30/18 1316  . nicotine (NICODERM CQ - dosed in mg/24 hours) patch 21 mg  21 mg Transdermal Daily Sharma Covert, MD   21 mg at 08/30/18 0809  . ondansetron (ZOFRAN-ODT) disintegrating tablet 4 mg  4 mg Oral Q6H PRN Lord,  Asa Saunas, NP   4 mg at 08/30/18 5361  . sertraline (ZOLOFT) tablet 50 mg  50 mg Oral Daily Pennelope Bracken, MD   50 mg at 08/30/18 1316   PTA Medications: Medications Prior to Admission  Medication Sig Dispense Refill Last Dose  . acetaminophen (TYLENOL) 325 MG tablet Take 650 mg by mouth every 6 (six) hours as needed for moderate pain.   08/28/2018 at Unknown time  . clindamycin (CLEOCIN) 300 MG capsule Take 1 capsule (300 mg total) by mouth 3 (three) times daily. (Patient not taking: Reported on 08/28/2018) 30 capsule 0 Completed Course at Unknown time  . naproxen (NAPROSYN) 500 MG tablet Take 1 tablet (500 mg total) by mouth 2 (two) times daily. (Patient not taking: Reported on 08/28/2018) 30 tablet 0 Completed Course at Unknown time    Musculoskeletal: Strength & Muscle Tone: within normal limits Gait & Station: normal Patient leans: N/A  Psychiatric Specialty Exam: Physical Exam   Nursing note and vitals reviewed.   Review of Systems  Constitutional: Positive for malaise/fatigue. Negative for chills and fever.  Respiratory: Negative for cough and shortness of breath.   Cardiovascular: Negative for chest pain.  Gastrointestinal: Negative for abdominal pain, heartburn, nausea and vomiting.  Psychiatric/Behavioral: Positive for depression, substance abuse and suicidal ideas. Negative for hallucinations. The patient is nervous/anxious and has insomnia.     Blood pressure 95/67, pulse (!) 115, temperature 98 F (36.7 C), temperature source Oral, resp. rate 18, height 5' 9"  (1.753 m), weight 58.1 kg.Body mass index is 18.9 kg/m.  General Appearance: Casual and Fairly Groomed  Eye Contact:  Good  Speech:  Clear and Coherent and Normal Rate  Volume:  Normal  Mood:  Anxious and Depressed  Affect:  Blunt, Congruent, Constricted and Depressed  Thought Process:  Coherent and Goal Directed  Orientation:  Full (Time, Place, and Person)  Thought Content:  Logical  Suicidal Thoughts:  Yes.  with intent/plan  Homicidal Thoughts:  No  Memory:  Immediate;   Fair Recent;   Fair Remote;   Fair  Judgement:  Poor  Insight:  Lacking  Psychomotor Activity:  Normal  Concentration:  Concentration: Fair  Recall:  [employer] of Knowledge:  Fair  Language:  Good  Akathisia:  No  Handed:    AIMS (if indicated):     Assets:  Resilience Social Support  ADL's:  Intact  Cognition:  WNL  Sleep:  Number of Hours: 6    Treatment Plan Summary: Daily contact with patient to assess and evaluate symptoms and progress in treatment and Medication management  Observation Level/Precautions:  15 minute checks  Laboratory:  CBC Chemistry Profile HbAIC UDS UA  Psychotherapy:  Encourage participation in groups and therapeutic milieu   Medications:  Continue COWS with clonidine. Start zoloft 74m po qday. Continue all other current orders/PRN's without changes - see MAR.  Consultations:     Discharge Concerns:    Estimated LOS: 5-7 days  Other:     Physician Treatment Plan for Primary Diagnosis: Opioid use disorder, severe, dependence ([place] Long Term Goal(s): Improvement in symptoms so as ready for discharge  Short Term Goals: Ability to identify and develop effective coping behaviors will improve  Physician Treatment Plan for Secondary Diagnosis: Principal Problem:   Opioid use disorder, severe, dependence ([place] Active Problems:   Major depressive disorder, recurrent severe without psychotic features ([place]  Long Term Goal(s): Improvement in symptoms so as ready for discharge  Short Term Goals: Ability to  identify triggers associated with substance abuse/mental health issues will improve  I certify that inpatient services furnished can reasonably be expected to improve the patient's condition.    Pennelope Bracken, MD 10/15/20192:31 PM

## 2018-08-30 NOTE — [facility] Suicide Risk Assessment (Signed)
[facility] Admission Suicide Risk Assessment   Nursing information obtained from:  Patient Demographic factors:  Male, Caucasian, Low socioeconomic status Current Mental Status:  Self-harm thoughts Loss Factors:  Financial problems / change in socioeconomic status Historical Factors:  Prior suicide attempts, Family history of mental illness or substance abuse Risk Reduction Factors:  Responsible for children under 35 years of age, Living with another person, especially a relative  Total Time spent with patient: 1 hour Principal Problem: Opioid use disorder, severe, dependence (H[facility]) Diagnosis:   Patient Active Problem List   Diagnosis Date Noted  . Major depressive disorder, recurrent severe without psychotic features ([facility]) [F33.2] 08/29/2018  . Opioid use disorder, severe, dependence ([facility]) [F11.20] 08/29/2018  . Status epilepticus (H[facility]) [G40.901]   . Altered mental status [R41.82] 07/01/2015  . Acute respiratory failure with hypoxia ([facility]) [J96.01] 07/01/2015  . Accidental heroin overdose ([facility]) [T40.1X1A] 07/01/2015  . Encephalopathy acute [G93.40] 07/01/2015   Subjective Data: see H&P  Continued Clinical Symptoms:  Alcohol Use Disorder Identification Test Final Score (AUDIT): 1 The "Alcohol Use Disorders Identification Test", Guidelines for Use in Primary Care, Second Edition.  World Science writer [facility]). Score between 0-7:  no or low risk or alcohol related problems. Score between 8-15:  moderate risk of alcohol related problems. Score between 16-19:  high risk of alcohol related problems. Score 20 or above:  warrants further diagnostic evaluation for alcohol dependence and treatment.   Psychiatric Specialty Exam: Physical Exam  Nursing note and vitals reviewed.     Blood pressure 95/67, pulse (!) 115, temperature 98 F (36.7 C), temperature source Oral, resp. rate 18, height 5\' 9"  (1.753 m), weight 58.1 kg.Body mass index is 18.9 kg/m.    COGNITIVE FEATURES THAT CONTRIBUTE  TO RISK:  None    SUICIDE RISK:   Moderate:  Frequent suicidal ideation with limited intensity, and duration, some specificity in terms of plans, no associated intent, good self-control, limited dysphoria/symptomatology, some risk factors present, and identifiable protective factors, including available and accessible social support.  PLAN OF CARE: see H&P  I certify that inpatient services furnished can reasonably be expected to improve the patient's condition.   Micheal Likens, MD 08/30/2018, 3:02 PM

## 2018-08-30 NOTE — Progress Notes (Signed)
Recreation Therapy Notes  Animal-Assisted Activity (AAA) Program Checklist/Progress Notes Patient Eligibility Criteria Checklist & Daily Group note for Rec Tx Intervention  Date: 10.15.19 Time: 1430 Location: 400 Hall Dayroom   AAA/T Program Assumption of Risk Form signed by Patient/ or Parent Legal Guardian YES   Patient is free of allergies or sever asthma YES   Patient reports no fear of animals YES  Patient reports no history of cruelty to animals YES   Patient understands his/her participation is voluntary YES   Patient washes hands before animal contact YES   Patient washes hands after animal contact  YES   Education: Hand Washing, Appropriate Animal Interaction   Education Outcome: Acknowledges understanding/In group clarification offered/Needs additional education.   Clinical Observations/Feedback: Pt did not attend activity.    Casey Mcguire, LRT/CTRS         Casey Mcguire 08/30/2018 3:48 PM 

## 2018-08-30 NOTE — Progress Notes (Signed)
NUTRITION ASSESSMENT  Pt identified as at risk on the Malnutrition Screen Tool  INTERVENTION: 1. Supplements: Continue Ensure Enlive po BID, each supplement provides 350 kcal and 20 grams of protein  NUTRITION DIAGNOSIS: Unintentional weight loss related to sub-optimal intake as evidenced by pt report.   Goal: Pt to meet >/= 90% of their estimated nutrition needs.  Monitor:  PO intake  Assessment:  Pt admitted with depression. Pt has been using heroin PTA. Reports fair appetite. Per weight records, pt has lost 17 lb since 3/8 (12% wt loss x 7 months, significant for time frame). Ensure supplements have been ordered, will continue.  Height: Ht Readings from Last 1 Encounters:  08/29/18 5\' 9"  (1.753 m)    Weight: Wt Readings from Last 1 Encounters:  08/29/18 58.1 kg    Weight Hx: Wt Readings from Last 10 Encounters:  08/29/18 58.1 kg  01/21/18 65.8 kg  07/02/15 75 kg    BMI:  Body mass index is 18.9 kg/m. Pt meets criteria for normal based on current BMI.  Estimated Nutritional Needs: Kcal: 25-30 kcal/kg Protein: > 1 gram protein/kg Fluid: 1 ml/kcal  Diet Order:  Diet Order            Diet Heart Room service appropriate? Yes; Fluid consistency: Thin  Diet effective now             Pt is also offered choice of unit snacks mid-morning and mid-afternoon.  Pt is eating as desired.   Lab results and medications reviewed.   Tilda Franco, MS, RD, LDN [facility] Inpatient Clinical Dietitian Pager: [phone] After Hours Pager: [phone]

## 2018-08-30 NOTE — [facility] Suicide Risk Assessment (Signed)
[facility] INPATIENT:  Family/Significant Other Suicide Prevention Education  Suicide Prevention Education:  Patient Refusal for Family/Significant Other Suicide Prevention Education: The patient Casey Mcguire has refused to provide written consent for family/significant other to be provided Family/Significant Other Suicide Prevention Education during admission and/or prior to discharge.  Physician notified.  SPE completed with pt, as pt refused to consent to family contact. SPI pamphlet provided to pt and pt was encouraged to share information with support network, ask questions, and talk about any concerns relating to SPE. Pt denies access to guns/firearms and verbalized understanding of information provided. Mobile Crisis information also provided to pt.   Rona Ravens LCSW 08/30/2018, 11:17 AM

## 2018-08-30 NOTE — Plan of Care (Signed)
Patient self inventory- Patient slept poorly last night, sleep medication was not requested. Appetite has been fair, energy level low, concentration poor. Depression, hopelessness, and anxiety rated 9, 6, 10. Patient endorses tremors, diarrhea, cravings, agitation, runny nose, chilling, cramping, nausea, irritability. Patient rates pain 2/10 "all over."  Denies SI HI AVH.   Problem: Education: Goal: Emotional status will improve Outcome: Not Progressing   Problem: Education: Goal: Mental status will improve Outcome: Not Progressing   Problem: Activity: Goal: Interest or engagement in activities will improve Outcome: Not Progressing

## 2018-08-31 ENCOUNTER — Ambulatory Visit (HOSPITAL_COMMUNITY): Payer: Self-pay

## 2018-08-31 MED ORDER — TRAZODONE HCL 50 MG PO TABS
50.0000 mg | ORAL_TABLET | Freq: Every day | ORAL | Status: DC
  Administered 2018-08-31 – 2018-09-01 (×2): 50 mg via ORAL
  Filled 2018-08-31 (×4): qty 1
  Filled 2018-08-31: qty 7

## 2018-08-31 NOTE — Progress Notes (Signed)
Nursing Progress Note: 7p-7a D: Pt currently presents with a anxious/pleasant affect and behavior. Interacting minimally with the milieu. Pt reports good sleep during the previous night with current medication regimen. Pt did attend wrap-up group.  A: Pt provided with medications per providers orders. Pt's labs and vitals were monitored throughout the night. Pt supported emotionally and encouraged to express concerns and questions. Pt educated on medications.  R: Pt's safety ensured with 15 minute and environmental checks. Pt currently denies SI, HI, and AVH. Pt verbally contracts to seek staff if SI,HI, or AVH occurs and to consult with staff before acting on any harmful thoughts. Will continue to monitor. 

## 2018-08-31 NOTE — Progress Notes (Signed)
[facility] MD Progress Note  08/31/2018 11:49 AM Casey Mcguire  MRN:  [id number]  Subjective: Casey Mcguire reports, "I did not sleep well last night so I feel tired this morning. I'm trying to see if I can get some nap this morning. I'm having a rough time with withdrawal symptoms. My body aches, I'm sweating real bad. I feel hot, cold, then chills. I feel irritated, I can't be comfortable. I'm feeling very anxious right now".  Casey Mcguire is a 37 y/o M without formal psychiatric history who was admitted from [facility] voluntarily with worsening depression, SI with plan to overdose on heroin, and worsening use of heroin. Pt was medically cleared and then transferred to [facility] for additional treatment and stabilization. He was started on COWS with clonidine for opiate withdrawal. Upon initial interview, pt shares, "I had relapsed. It got real bad." Pt details that he relapsed on heroin about 6 months ago after a previous period of 6 months of sobriety. He has been using about 1 gram/day via insufflation. He describes that his mood has been worsening and also he has stressors of strained relationship with his girlfriend and financial strain. He endorses depression symptoms of initial insomnia (average of about 2-4 hours of sleep per night), anhedonia, guilty feelings, low energy, poor concentration, and poor appetite (loss of 20 lbs in about 2 months). Pt had SI with plan to overdose on heroin prior to admission, and he reports that he still has some mild SI but he is able to contract for safety while in the hospital. He denies HI/AH/VH. He denies symptoms of mania, OCD, and PTSD. He has been using about 1 gram of heroin daily via insufflation, and he smokes tobacco 1 ppd.   Devontae is seen, chart reviewed. The chart findings discussed with the treatment team. He presents alert, oriented & aware of situation. He is making good eye contacts. He says he did not sleep well last night. He is complaining of opioid withdrawal  symptoms that kept him irritated still. He continues to endorse symptoms of depression & anxiety. He rates his depression & anxiety at #7 respectively on the scale of (1-10). He currently denies any SIHI, AVH, delusional thoughts or paranoia. He has agreed to continue his current medication regimen as already in progress with the addition of Trazodone 50 mg Q hs for insomnia..   Principal Problem: Opioid use disorder, severe, dependence (HCC)  Diagnosis:   Patient Active Problem List   Diagnosis Date Noted  . Major depressive disorder, recurrent severe without psychotic features (HCC) [F33.2] 08/29/2018  . Opioid use disorder, severe, dependence (HCC) [F11.20] 08/29/2018  . Status epilepticus (HCC) [G40.901]   . Altered mental status [R41.82] 07/01/2015  . Acute respiratory failure with hypoxia (HCC) [J96.01] 07/01/2015  . Accidental heroin overdose (HCC) [T40.1X1A] 07/01/2015  . Encephalopathy acute [G93.40] 07/01/2015   Total Time spent with patient: 25 minutes  Past Psychiatric History: See H&P  Past Medical History:  Past Medical History:  Diagnosis Date  . Depression   . Drug abuse (HCC)    History reviewed. No pertinent surgical history. Family History: History reviewed. No pertinent family history. Family Psychiatric  History: See H&P.  Social History:  Social History   Substance and Sexual Activity  Alcohol Use Not Currently  . Alcohol/week: 0.0 standard drinks     Social History   Substance and Sexual Activity  Drug Use Yes  . Types: Marijuana, Heroin   Comment: heroin    Social History  Socioeconomic History  . Marital status: Single    Spouse name: Not on file  . Number of children: Not on file  . Years of education: Not on file  . Highest education level: Not on file  Occupational History  . Not on file  Social Needs  . Financial resource strain: Not on file  . Food insecurity:    Worry: Not on file    Inability: Not on file  . Transportation  needs:    Medical: Not on file    Non-medical: Not on file  Tobacco Use  . Smoking status: Current Every Day Smoker    Packs/day: 2.00    Types: Cigarettes  . Smokeless tobacco: Never Used  Substance and Sexual Activity  . Alcohol use: Not Currently    Alcohol/week: 0.0 standard drinks  . Drug use: Yes    Types: Marijuana, Heroin    Comment: heroin  . Sexual activity: Yes  Lifestyle  . Physical activity:    Days per week: Not on file    Minutes per session: Not on file  . Stress: Not on file  Relationships  . Social connections:    Talks on phone: Not on file    Gets together: Not on file    Attends religious service: Not on file    Active member of club or organization: Not on file    Attends meetings of clubs or organizations: Not on file    Relationship status: Not on file  Other Topics Concern  . Not on file  Social History Narrative  . Not on file   Additional Social History:   Sleep: Poor  Appetite:  Fair  Current Medications: Current Facility-Administered Medications  Medication Dose Route Frequency Provider Last Rate Last Dose  . acetaminophen (TYLENOL) tablet 650 mg  650 mg Oral Q6H PRN Charm Rings, NP      . alum & mag hydroxide-simeth (MAALOX/MYLANTA) 200-200-20 MG/5ML suspension 30 mL  30 mL Oral Q4H PRN Charm Rings, NP      . cloNIDine (CATAPRES) tablet 0.1 mg  0.1 mg Oral BID Charm Rings, NP   0.1 mg at 08/31/18 0750   Followed by  . [START ON 09/02/2018] cloNIDine (CATAPRES) tablet 0.1 mg  0.1 mg Oral Daily Lord, Jamison Y, NP      . dicyclomine (BENTYL) tablet 20 mg  20 mg Oral Q6H PRN Charm Rings, NP      . feeding supplement (ENSURE ENLIVE) (ENSURE ENLIVE) liquid 237 mL  237 mL Oral BID BM Antonieta Pert, MD   237 mL at 08/30/18 0810  . hydrOXYzine (ATARAX/VISTARIL) tablet 25 mg  25 mg Oral Q6H PRN Charm Rings, NP   25 mg at 08/31/18 0311  . loperamide (IMODIUM) capsule 2-4 mg  2-4 mg Oral PRN Charm Rings, NP      .  magnesium hydroxide (MILK OF MAGNESIA) suspension 30 mL  30 mL Oral Daily PRN Charm Rings, NP      . methocarbamol (ROBAXIN) tablet 500 mg  500 mg Oral Q8H PRN Charm Rings, NP   500 mg at 08/30/18 2103  . naproxen (NAPROSYN) tablet 500 mg  500 mg Oral BID PRN Charm Rings, NP   500 mg at 08/30/18 1316  . nicotine (NICODERM CQ - dosed in mg/24 hours) patch 21 mg  21 mg Transdermal Daily Antonieta Pert, MD   21 mg at 08/31/18 0750  . ondansetron (ZOFRAN-ODT) disintegrating tablet  4 mg  4 mg Oral Q6H PRN Charm Rings, NP   4 mg at 08/30/18 1610  . sertraline (ZOLOFT) tablet 50 mg  50 mg Oral Daily Micheal Likens, MD   50 mg at 08/31/18 0751  . traZODone (DESYREL) tablet 50 mg  50 mg Oral QHS Nwoko, Agnes I, NP       Lab Results: No results found for this or any previous visit (from the past 48 hour(s)).  Blood Alcohol level:  Lab Results  Component Value Date   ETH <10 08/28/2018   ETH <5 07/01/2015   Metabolic Disorder Labs: No results found for: HGBA1C, MPG No results found for: PROLACTIN Lab Results  Component Value Date   TRIG 77 07/01/2015   Physical Findings: AIMS: Facial and Oral Movements Muscles of Facial Expression: None, normal Lips and Perioral Area: None, normal Jaw: None, normal Tongue: None, normal,Extremity Movements Upper (arms, wrists, hands, fingers): None, normal Lower (legs, knees, ankles, toes): None, normal, Trunk Movements Neck, shoulders, hips: None, normal, Overall Severity Severity of abnormal movements (highest score from questions above): None, normal Incapacitation due to abnormal movements: None, normal Patient's awareness of abnormal movements (rate only patient's report): No Awareness, Dental Status Current problems with teeth and/or dentures?: No Does patient usually wear dentures?: No  CIWA:    COWS:  COWS Total Score: 2  Musculoskeletal: Strength & Muscle Tone: within normal limits Gait & Station: normal Patient  leans: N/A  Psychiatric Specialty Exam: Physical Exam  Vitals reviewed.   Review of Systems  Respiratory: Negative for cough and shortness of breath.   Cardiovascular: Negative for chest pain and palpitations.  Gastrointestinal: Negative.   Genitourinary: Negative.   Musculoskeletal: Positive for joint pain and myalgias.  Neurological: Negative.   Endo/Heme/Allergies: Negative.   Psychiatric/Behavioral: Positive for depression and substance abuse. Negative for hallucinations, memory loss and suicidal ideas. The patient is nervous/anxious and has insomnia.     Blood pressure 105/83, pulse 96, temperature 97.8 F (36.6 C), temperature source Oral, resp. rate 16, height 5\' 9"  (1.753 m), weight 58.1 kg.Body mass index is 18.9 kg/m.  General Appearance: Casual and Fairly Groomed  Eye Contact:  Good  Speech:  Clear and Coherent and Normal Rate  Volume:  Normal  Mood:  Anxious and Depressed  Affect:  Blunt, Congruent, Constricted and Depressed  Thought Process:  Coherent and Goal Directed  Orientation:  Full (Time, Place, and Person)  Thought Content:  Logical  Suicidal Thoughts:  Yes.  with intent/plan  Homicidal Thoughts:  No  Memory:  Immediate;   Fair Recent;   Fair Remote;   Fair  Judgement:  Poor  Insight:  Lacking  Psychomotor Activity:  Normal  Concentration:  Concentration: Fair  Recall:  [employer] of Knowledge:  Fair  Language:  Good  Akathisia:  No  Handed:    AIMS (if indicated):     Assets:  Resilience Social Support  ADL's:  Intact  Cognition:  WNL  Sleep:  Number of Hours: 4.25     Treatment Plan Summary: Daily contact with patient to assess and evaluate symptoms and progress in treatment.  - Continue inpatient hospitalization.  - Will continue today 08/31/2018 plan as below except where it is noted.  Opioid detox.     - Continue the Clonidine detox protocols as already in progress.  Anxiety.     - Continue Vistaril 25 mg po Q 6 hours  prn.  Depression.     - Continue  Sertraline 50 mg po daily.  Insomnia.     - Initiated Trazodone 50 mg po Q hs.  Patient to attend & participate in the group sessions/milieu.  Discharge disposition plan ongoing.  Armandina Stammer, NP, PMHNP, FNP-BC 08/31/2018, 11:49 AM

## 2018-08-31 NOTE — Progress Notes (Signed)
Recreation Therapy Notes  Date: 10.16.19 Time: 0930 Location: 300 [address] Dayroom  Group Topic: Stress Management  Goal Area(s) Addresses:  Patient will verbalize importance of using healthy stress management.  Patient will identify positive emotions associated with healthy stress management.   Intervention: Stress Management  Activity :  Meditation.  LRT introduced the stress management technique of meditation.  LRT played a meditation that dealt with resilience.  Patients were to listen and follow along as meditation played.  Education:  Stress Management, Discharge Planning.   Education Outcome: Acknowledges edcuation/In group clarification offered/Needs additional education  Clinical Observations/Feedback: Pt did not attend group.    Caroll Rancher, LRT/CTRS         Lillia Abed, Ac Colan A 08/31/2018 11:28 AM

## 2018-08-31 NOTE — Patient Outreach (Signed)
CPSS met with the patient at [facility] to provide further substance use recovery support. CPSS met with patient in the [place] on 08/28/18 to provide substance use recovery support and help with substance use recovery resources. Patient is really positive about his recovery process and currently has family substance use recovery supports. Patient is interested in posisibly getting connected to the [facility] outpatient substance use treatment program after discharge from [facility]. CPSS will continue to follow up with the patient at [facility]. CPSS provided the patient with CPSS contact information. CPSS strongly encouraged the patient to continue to stay in contact with CPSS for substance use recovery support. CPSS also informed the patient to not hesitate to contact CPSS for help with getting connected to substance use recovery treatment resources after eventual discharge from [facility].

## 2018-08-31 NOTE — Tx Team (Signed)
Interdisciplinary Treatment and Diagnostic Plan Update  08/31/2018 Time of Session: 0830am SEVIN LANGENBACH MRN: [id number]  Principal Diagnosis: Opioid use disorder, severe, dependence ([place])  Secondary Diagnoses: Principal Problem:   Opioid use disorder, severe, dependence ([place]) Active Problems:   Major depressive disorder, recurrent severe without psychotic features ([place])   Current Medications:  Current Facility-Administered Medications  Medication Dose Route Frequency Provider Last Rate Last Dose  . acetaminophen (TYLENOL) tablet 650 mg  650 mg Oral Q6H PRN Patrecia Pour, NP      . alum & mag hydroxide-simeth (MAALOX/MYLANTA) 200-200-20 MG/5ML suspension 30 mL  30 mL Oral Q4H PRN Patrecia Pour, NP      . cloNIDine (CATAPRES) tablet 0.1 mg  0.1 mg Oral BID Patrecia Pour, NP   0.1 mg at 08/31/18 0750   Followed by  . [START ON 09/02/2018] cloNIDine (CATAPRES) tablet 0.1 mg  0.1 mg Oral Daily Lord, Jamison Y, NP      . dicyclomine (BENTYL) tablet 20 mg  20 mg Oral Q6H PRN Patrecia Pour, NP      . feeding supplement (ENSURE ENLIVE) (ENSURE ENLIVE) liquid 237 mL  237 mL Oral BID BM Sharma Covert, MD   237 mL at 08/30/18 0810  . hydrOXYzine (ATARAX/VISTARIL) tablet 25 mg  25 mg Oral Q6H PRN Patrecia Pour, NP   25 mg at 08/31/18 0311  . loperamide (IMODIUM) capsule 2-4 mg  2-4 mg Oral PRN Patrecia Pour, NP      . magnesium hydroxide (MILK OF MAGNESIA) suspension 30 mL  30 mL Oral Daily PRN Patrecia Pour, NP      . methocarbamol (ROBAXIN) tablet 500 mg  500 mg Oral Q8H PRN Patrecia Pour, NP   500 mg at 08/30/18 2103  . naproxen (NAPROSYN) tablet 500 mg  500 mg Oral BID PRN Patrecia Pour, NP   500 mg at 08/30/18 1316  . nicotine (NICODERM CQ - dosed in mg/24 hours) patch 21 mg  21 mg Transdermal Daily Sharma Covert, MD   21 mg at 08/31/18 0750  . ondansetron (ZOFRAN-ODT) disintegrating tablet 4 mg  4 mg Oral Q6H PRN Patrecia Pour, NP   4 mg at 08/30/18 0938  .  sertraline (ZOLOFT) tablet 50 mg  50 mg Oral Daily Pennelope Bracken, MD   50 mg at 08/31/18 0751   PTA Medications: Medications Prior to Admission  Medication Sig Dispense Refill Last Dose  . acetaminophen (TYLENOL) 325 MG tablet Take 650 mg by mouth every 6 (six) hours as needed for moderate pain.   08/28/2018 at Unknown time  . clindamycin (CLEOCIN) 300 MG capsule Take 1 capsule (300 mg total) by mouth 3 (three) times daily. (Patient not taking: Reported on 08/28/2018) 30 capsule 0 Completed Course at Unknown time  . naproxen (NAPROSYN) 500 MG tablet Take 1 tablet (500 mg total) by mouth 2 (two) times daily. (Patient not taking: Reported on 08/28/2018) 30 tablet 0 Completed Course at Unknown time    Patient Stressors: Financial difficulties Marital or family conflict Occupational concerns Substance abuse  Patient Strengths: Ability for insight Average or above average intelligence Capable of independent living [employer] of knowledge  Treatment Modalities: Medication Management, Group therapy, Case management,  1 to 1 session with clinician, Psychoeducation, Recreational therapy.   Physician Treatment Plan for Primary Diagnosis: Opioid use disorder, severe, dependence ([place]) Long Term Goal(s): Improvement in symptoms so as ready for discharge Improvement in symptoms so  as ready for discharge   Short Term Goals: Ability to identify and develop effective coping behaviors will improve Ability to identify triggers associated with substance abuse/mental health issues will improve  Medication Management: Evaluate patient's response, side effects, and tolerance of medication regimen.  Therapeutic Interventions: 1 to 1 sessions, Unit Group sessions and Medication administration.  Evaluation of Outcomes: Not Met  Physician Treatment Plan for Secondary Diagnosis: Principal Problem:   Opioid use disorder, severe, dependence ([place]) Active Problems:   Major depressive disorder,  recurrent severe without psychotic features ([place])  Long Term Goal(s): Improvement in symptoms so as ready for discharge Improvement in symptoms so as ready for discharge   Short Term Goals: Ability to identify and develop effective coping behaviors will improve Ability to identify triggers associated with substance abuse/mental health issues will improve     Medication Management: Evaluate patient's response, side effects, and tolerance of medication regimen.  Therapeutic Interventions: 1 to 1 sessions, Unit Group sessions and Medication administration.  Evaluation of Outcomes: Not Met   RN Treatment Plan for Primary Diagnosis: Opioid use disorder, severe, dependence ([place]) Long Term Goal(s): Knowledge of disease and therapeutic regimen to maintain health will improve  Short Term Goals: Ability to remain free from injury will improve, Ability to verbalize feelings will improve, Ability to disclose and discuss suicidal ideas and Ability to identify and develop effective coping behaviors will improve  Medication Management: RN will administer medications as ordered by provider, will assess and evaluate patient's response and provide education to patient for prescribed medication. RN will report any adverse and/or side effects to prescribing provider.  Therapeutic Interventions: 1 on 1 counseling sessions, Psychoeducation, Medication administration, Evaluate responses to treatment, Monitor vital signs and CBGs as ordered, Perform/monitor CIWA, COWS, AIMS and Fall Risk screenings as ordered, Perform wound care treatments as ordered.  Evaluation of Outcomes: Not Met   LCSW Treatment Plan for Primary Diagnosis: Opioid use disorder, severe, dependence ([place]) Long Term Goal(s): Safe transition to appropriate next level of care at discharge, Engage patient in therapeutic group addressing interpersonal concerns.  Short Term Goals: Engage patient in aftercare planning with referrals and resources,  Increase emotional regulation, Facilitate patient progression through stages of change regarding substance use diagnoses and concerns and Identify triggers associated with mental health/substance abuse issues  Therapeutic Interventions: Assess for all discharge needs, 1 to 1 time with Social worker, Explore available resources and support systems, Assess for adequacy in community support network, Educate family and significant other(s) on suicide prevention, Complete Psychosocial Assessment, Interpersonal group therapy.  Evaluation of Outcomes: Not Met   Progress in Treatment: Attending groups: No. Participating in groups: No. New to unit. Continuing to assess.  Taking medication as prescribed: Yes. Toleration medication: Yes. Family/Significant other contact made: SPE completed with pt; pt declined to consent to collateral contact.  Patient understands diagnosis: Yes. Discussing patient identified problems/goals with staff: Yes. Medical problems stabilized or resolved: Yes. Denies suicidal/homicidal ideation: Yes. Issues/concerns per patient self-inventory: No.  Other: n/a  New problem(s) identified: No, Describe:  n/a  New Short Term/Long Term Goal(s): detox, medication management for mood stabilization; elimination of SI thoughts; development of comprehensive mental wellness/sobriety plan.   Patient Goals:  "to figure out some plan for staying sober when I leave the hospital."   Discharge Plan or Barriers: CSW assessing. Pt not willing to discuss aftercare at this time. [facility] likely for outpatient mental health care. [place] pamphlet, Mobile Crisis information, and AA/NA information provided to patient for additional community  support and resources.    Reason for Continuation of Hospitalization: Anxiety Depression Medication stabilization Suicidal ideation Withdrawal symptoms  Estimated Length of Stay: Friday, 09/02/18  Attendees: Patient: 08/31/2018 8:37 AM  Physician: Dr.  Parke Poisson MD; Dr. Nancy Fetter MD 08/31/2018 8:37 AM  Nursing: Yetta Flock RN 08/31/2018 8:37 AM  RN Care Manager:x 08/31/2018 8:37 AM  Social Worker: Janice Norrie LCSW 08/31/2018 8:37 AM  Recreational Therapist: x 08/31/2018 8:37 AM  Other: Lindell Spar NP; Darnelle Maffucci Money NP 08/31/2018 8:37 AM  Other:  08/31/2018 8:37 AM  Other: 08/31/2018 8:37 AM    Scribe for Treatment Team: Avelina Laine, LCSW 08/31/2018 8:37 AM

## 2018-08-31 NOTE — BHH Group Notes (Signed)
Adult Psychoeducational Group Note  Date:  08/31/2018 Time:  9:18 AM  Group Topic/Focus:  Goals Group:   The focus of this group is to help patients establish daily goals to achieve during treatment and discuss how the patient can incorporate goal setting into their daily lives to aide in recovery.  Participation Level:  Active  Participation Quality:  Appropriate  Affect:  Appropriate  Cognitive:  Alert  Insight: Appropriate  Engagement in Group:  Engaged  Modes of Intervention:  Orientation  Additional Comments:    Pt attended and participated in orientation/goals group. Pt goal for today is to attend all groups and talk with Child psychotherapist and doctor.   Dellia Nims 08/31/2018, 9:18 AM

## 2018-08-31 NOTE — BHH Group Notes (Signed)
LCSW Group Therapy Note 08/31/2018 11:44 AM  Type of Therapy and Topic: Group Therapy: Overcoming Obstacles  Participation Level: Did Not Attend  Description of Group:  In this group patients will be encouraged to explore what they see as obstacles to their own wellness and recovery. They will be guided to discuss their thoughts, feelings, and behaviors related to these obstacles. The group will process together ways to cope with barriers, with attention given to specific choices patients can make. Each patient will be challenged to identify changes they are motivated to make in order to overcome their obstacles. This group will be process-oriented, with patients participating in exploration of their own experiences as well as giving and receiving support and challenge from other group members.  Therapeutic Goals: 1. Patient will identify personal and current obstacles as they relate to admission. 2. Patient will identify barriers that currently interfere with their wellness or overcoming obstacles.  3. Patient will identify feelings, thought process and behaviors related to these barriers. 4. Patient will identify two changes they are willing to make to overcome these obstacles:   Summary of Patient Progress  Invited, chose not to attend.     Therapeutic Modalities:  Cognitive Behavioral Therapy Solution Focused Therapy Motivational Interviewing Relapse Prevention Therapy   Alcario Drought Clinical Social Worker

## 2018-08-31 NOTE — Plan of Care (Signed)
  Problem: Education: Goal: Verbalization of understanding the information provided will improve Outcome: Progressing   Problem: Coping: Goal: Ability to verbalize frustrations and anger appropriately will improve Outcome: Progressing Goal: Ability to demonstrate self-control will improve Outcome: Progressing   Problem: Education: Goal: Emotional status will improve 08/31/2018 0904 by Dewayne Shorter, RN Outcome: Not Progressing 08/31/2018 0902 by Dewayne Shorter, RN Outcome: Not Progressing Goal: Mental status will improve Outcome: Not Progressing   Problem: Activity: Goal: Sleeping patterns will improve 08/31/2018 0904 by Dewayne Shorter, RN Outcome: Not Progressing 08/31/2018 0902 by Dewayne Shorter, RN Outcome: Not Progressing

## 2018-08-31 NOTE — Plan of Care (Signed)
  Problem: Coping: Goal: Ability to verbalize frustrations and anger appropriately will improve Outcome: Progressing Goal: Ability to demonstrate self-control will improve Outcome: Progressing   Problem: Education: Goal: Emotional status will improve Outcome: Not Progressing   Problem: Activity: Goal: Sleeping patterns will improve Outcome: Not Progressing

## 2018-08-31 NOTE — Progress Notes (Signed)
Patient did attend the evening speaker NA meeting.  

## 2018-09-01 MED ORDER — NICOTINE POLACRILEX 2 MG MT GUM
2.0000 mg | CHEWING_GUM | OROMUCOSAL | Status: DC | PRN
  Administered 2018-09-01: 2 mg via ORAL

## 2018-09-01 NOTE — Progress Notes (Signed)
[facility] MD Progress Note  09/01/2018 1:21 PM Casey Mcguire  MRN:  [id number]  Subjective: Casey Mcguire reports, "I'm doing a lot better today mentally, but physically, my body is beaten down from the withdrawal symptoms, not quite as bad as from the previous days. My mood is down some. I am considering going to an [facility] after discharge. I have a home, a wife & 3 children. I would like to go home to be with my family, but, home will be too stressful for me at this time".  Casey Mcguire is a 37 y/o M without formal psychiatric history who was admitted from [facility] voluntarily with worsening depression, SI with plan to overdose on heroin, and worsening use of heroin. Pt was medically cleared and then transferred to [facility] for additional treatment and stabilization. He was started on COWS with clonidine for opiate withdrawal. Upon initial interview, pt shares, "I had relapsed. It got real bad." Pt details that he relapsed on heroin about 6 months ago after a previous period of 6 months of sobriety. He has been using about 1 gram/day via insufflation. He describes that his mood has been worsening and also he has stressors of strained relationship with his girlfriend and financial strain. He endorses depression symptoms of initial insomnia (average of about 2-4 hours of sleep per night), anhedonia, guilty feelings, low energy, poor concentration, and poor appetite (loss of 20 lbs in about 2 months). Pt had SI with plan to overdose on heroin prior to admission, and he reports that he still has some mild SI but he is able to contract for safety while in the hospital. He denies HI/AH/VH. He denies symptoms of mania, OCD, and PTSD. He has been using about 1 gram of heroin daily via insufflation, and he smokes tobacco 1 ppd.   Casey Mcguire is seen, chart reviewed. The chart findings discussed with the treatment team. He presents alert, oriented & aware of situation. He is making good eye contacts. He is visible on the unit,  attending group sessions. He says he slept well last night. He reports an improved mood. Continues to complain of how the effects of the opioid withdrawal symptoms has beaten down his body  He reports feeling down, but admits an overall improvement from the time of his admission. He rates his depression & anxiety at #4 respectively on the scale of (1-10). He says he would prefer going to an [facility] after discharge rather than going home to his family as he considers his home as very stressful at this time. He currently denies any SIHI, AVH, delusional thoughts or paranoia. He has agreed to continue his current medication regimen as already in progress.   Principal Problem: Opioid use disorder, severe, dependence (HCC)  Diagnosis:   Patient Active Problem List   Diagnosis Date Noted  . Major depressive disorder, recurrent severe without psychotic features (HCC) [F33.2] 08/29/2018  . Opioid use disorder, severe, dependence (HCC) [F11.20] 08/29/2018  . Status epilepticus (HCC) [G40.901]   . Altered mental status [R41.82] 07/01/2015  . Acute respiratory failure with hypoxia (HCC) [J96.01] 07/01/2015  . Accidental heroin overdose (HCC) [T40.1X1A] 07/01/2015  . Encephalopathy acute [G93.40] 07/01/2015   Total Time spent with patient: 25 minutes  Past Psychiatric History: See H&P  Past Medical History:  Past Medical History:  Diagnosis Date  . Depression   . Drug abuse (HCC)    History reviewed. No pertinent surgical history. Family History: History reviewed. No pertinent family history. Family Psychiatric  History:  See H&P.  Social History:  Social History   Substance and Sexual Activity  Alcohol Use Not Currently  . Alcohol/week: 0.0 standard drinks     Social History   Substance and Sexual Activity  Drug Use Yes  . Types: Marijuana, Heroin   Comment: heroin    Social History   Socioeconomic History  . Marital status: Single    Spouse name: Not on file  . Number of  children: Not on file  . Years of education: Not on file  . Highest education level: Not on file  Occupational History  . Not on file  Social Needs  . Financial resource strain: Not on file  . Food insecurity:    Worry: Not on file    Inability: Not on file  . Transportation needs:    Medical: Not on file    Non-medical: Not on file  Tobacco Use  . Smoking status: Current Every Day Smoker    Packs/day: 2.00    Types: Cigarettes  . Smokeless tobacco: Never Used  Substance and Sexual Activity  . Alcohol use: Not Currently    Alcohol/week: 0.0 standard drinks  . Drug use: Yes    Types: Marijuana, Heroin    Comment: heroin  . Sexual activity: Yes  Lifestyle  . Physical activity:    Days per week: Not on file    Minutes per session: Not on file  . Stress: Not on file  Relationships  . Social connections:    Talks on phone: Not on file    Gets together: Not on file    Attends religious service: Not on file    Active member of club or organization: Not on file    Attends meetings of clubs or organizations: Not on file    Relationship status: Not on file  Other Topics Concern  . Not on file  Social History Narrative  . Not on file   Additional Social History:   Sleep: Poor  Appetite:  Fair  Current Medications: Current Facility-Administered Medications  Medication Dose Route Frequency Provider Last Rate Last Dose  . acetaminophen (TYLENOL) tablet 650 mg  650 mg Oral Q6H PRN Charm Rings, NP      . alum & mag hydroxide-simeth (MAALOX/MYLANTA) 200-200-20 MG/5ML suspension 30 mL  30 mL Oral Q4H PRN Charm Rings, NP      . cloNIDine (CATAPRES) tablet 0.1 mg  0.1 mg Oral BID Charm Rings, NP   0.1 mg at 09/01/18 0800   Followed by  . [START ON 09/02/2018] cloNIDine (CATAPRES) tablet 0.1 mg  0.1 mg Oral Daily Lord, Jamison Y, NP      . dicyclomine (BENTYL) tablet 20 mg  20 mg Oral Q6H PRN Charm Rings, NP      . feeding supplement (ENSURE ENLIVE) (ENSURE  ENLIVE) liquid 237 mL  237 mL Oral BID BM Antonieta Pert, MD   237 mL at 08/30/18 0810  . hydrOXYzine (ATARAX/VISTARIL) tablet 25 mg  25 mg Oral Q6H PRN Charm Rings, NP   25 mg at 08/31/18 2224  . loperamide (IMODIUM) capsule 2-4 mg  2-4 mg Oral PRN Charm Rings, NP      . magnesium hydroxide (MILK OF MAGNESIA) suspension 30 mL  30 mL Oral Daily PRN Charm Rings, NP      . methocarbamol (ROBAXIN) tablet 500 mg  500 mg Oral Q8H PRN Charm Rings, NP   500 mg at 08/31/18 2224  .  naproxen (NAPROSYN) tablet 500 mg  500 mg Oral BID PRN Charm Rings, NP   500 mg at 09/01/18 0801  . nicotine polacrilex (NICORETTE) gum 2 mg  2 mg Oral PRN Cobos, Rockey Situ, MD      . ondansetron (ZOFRAN-ODT) disintegrating tablet 4 mg  4 mg Oral Q6H PRN Charm Rings, NP   4 mg at 08/30/18 1610  . sertraline (ZOLOFT) tablet 50 mg  50 mg Oral Daily Micheal Likens, MD   50 mg at 09/01/18 0801  . traZODone (DESYREL) tablet 50 mg  50 mg Oral QHS Casey Mcguire I, NP   50 mg at 08/31/18 2224   Lab Results: No results found for this or any previous visit (from the past 48 hour(s)).  Blood Alcohol level:  Lab Results  Component Value Date   ETH <10 08/28/2018   ETH <5 07/01/2015   Metabolic Disorder Labs: No results found for: HGBA1C, MPG No results found for: PROLACTIN Lab Results  Component Value Date   TRIG 77 07/01/2015   Physical Findings: AIMS: Facial and Oral Movements Muscles of Facial Expression: None, normal Lips and Perioral Area: None, normal Jaw: None, normal Tongue: None, normal,Extremity Movements Upper (arms, wrists, hands, fingers): None, normal Lower (legs, knees, ankles, toes): None, normal, Trunk Movements Neck, shoulders, hips: None, normal, Overall Severity Severity of abnormal movements (highest score from questions above): None, normal Incapacitation due to abnormal movements: None, normal Patient's awareness of abnormal movements (rate only patient's  report): No Awareness, Dental Status Current problems with teeth and/or dentures?: No Does patient usually wear dentures?: No  CIWA:    COWS:  COWS Total Score: 2  Musculoskeletal: Strength & Muscle Tone: within normal limits Gait & Station: normal Patient leans: N/A  Psychiatric Specialty Exam: Physical Exam  Vitals reviewed.   Review of Systems  Respiratory: Negative for cough and shortness of breath.   Cardiovascular: Negative for chest pain and palpitations.  Gastrointestinal: Negative.   Genitourinary: Negative.   Musculoskeletal: Positive for joint pain and myalgias.  Neurological: Negative.   Endo/Heme/Allergies: Negative.   Psychiatric/Behavioral: Positive for depression and substance abuse. Negative for hallucinations, memory loss and suicidal ideas. The patient is nervous/anxious and has insomnia.     Blood pressure 105/72, pulse 82, temperature 97.8 F (36.6 C), temperature source Oral, resp. rate 16, height 5\' 9"  (1.753 m), weight 58.1 kg.Body mass index is 18.9 kg/m.  General Appearance: Casual and Fairly Groomed  Eye Contact:  Good  Speech:  Clear and Coherent and Normal Rate  Volume:  Normal  Mood:  Anxious and Depressed  Affect:  Blunt, Congruent, Constricted and Depressed  Thought Process:  Coherent and Goal Directed  Orientation:  Full (Time, Place, and Person)  Thought Content:  Logical  Suicidal Thoughts:  Yes.  with intent/plan  Homicidal Thoughts:  No  Memory:  Immediate;   Fair Recent;   Fair Remote;   Fair  Judgement:  Poor  Insight:  Lacking  Psychomotor Activity:  Normal  Concentration:  Concentration: Fair  Recall:  [employer] of Knowledge:  Fair  Language:  Good  Akathisia:  No  Handed:    AIMS (if indicated):     Assets:  Resilience Social Support  ADL's:  Intact  Cognition:  WNL  Sleep:  Number of Hours: 4.25     Treatment Plan Summary: Daily contact with patient to assess and evaluate symptoms and progress in treatment.  -  Continue inpatient hospitalization.  - Will  continue today 09/01/2018 plan as below except where it is noted.  Opioid detox.     - Continue the Clonidine detox protocols as already in progress.  Anxiety.     - Continue Vistaril 25 mg po Q 6 hours prn.  Depression.     - Continue Sertraline 50 mg po daily.  Insomnia.     - Continue Trazodone 50 mg po Q hs.  Patient to attend & participate in the group sessions/milieu.  Discharge disposition plan ongoing.  Casey Stammer, NP, PMHNP, FNP-BC 09/01/2018, 1:21 PMPatient ID: Casey Mcguire, male   DOB: August 18, 1981, 37 y.o.   MRN: [id number]

## 2018-09-01 NOTE — Progress Notes (Signed)
A patient on [address] told the MHTs during lunchtime that this patient was smoking downstairs in the bathroom in the gym. Patient was strip searched and his room was searched. Then, Clinical research associate searched the bathroom in the gym, no contraband was found. The bathroom smelled like cigarette smoke. Environmentals were performed on the 300 hall patient rooms as well as the dayroom. No contraband found. Patients notified, charge RN notified, A/C notified.

## 2018-09-01 NOTE — [facility] Group Notes (Signed)
[facility] Group Therapy 09/01/2018 1:15pm  Type of Therapy: Mental [employer] Presentation  Participation Level: Active  Participation Quality: Attentive  Affect: Appropriate  Cognitive: Oriented  Insight: Developing/Improving  Engagement in Therapy: Engaged  Modes of Intervention: Discussion, Education and Socialization  Summary of Progress/Problems: Mental [employer] Association (MHA) Speaker came to talk about his personal journey with mental health. The pt processed ways by which to relate to the speaker. MHA speaker provided handouts and educational information pertaining to groups and services offered by the MHA. Pt was engaged in speaker's presentation and was receptive to resources provided.    Linnaea Ahn S Kris No, LCSW 09/01/2018 10:01 AM  

## 2018-09-01 NOTE — Plan of Care (Signed)
Patient presented to the medication window pleasant and cooperative, yet anxious. Patient denied SI HI AVH. Denies physical pain. Patient stated he is feeling a bit better than how he did at the time of admission. Patient voiced no complaints and stated he is ready to go home. Patient is compliant with medications, no side effects noted. Safety is maintained with 15 minute checks as well as environmental checks. Will continue to monitor.  Problem: Education: Goal: Emotional status will improve Outcome: Progressing Goal: Mental status will improve Outcome: Progressing Goal: Verbalization of understanding the information provided will improve Outcome: Progressing

## 2018-09-01 NOTE — Progress Notes (Signed)
Nursing Progress Note: 7p-7a D: Pt currently presents with a anxious/pleasant affect and behavior. Pt states, "I did smoke in the gym. Just a cigarette. I was just anxious at the time. I needed it. I swear I will not do it again. I was not thinking of the effects of my actions. And I am sorry." Interacting minimally with the milieu. Pt reports good sleep during the previous night with current medication regimen. Pt did attend wrap-up group.  A: Pt provided with medications per providers orders. Pt's labs and vitals were monitored throughout the night. Pt supported emotionally and encouraged to express concerns and questions. Pt educated on medications.  R: Pt's safety ensured with 15 minute and environmental checks. Pt currently denies SI, HI, and AVH. Pt verbally contracts to seek staff if SI,HI, or AVH occurs and to consult with staff before acting on any harmful thoughts. Will continue to monitor.

## 2018-09-02 DIAGNOSIS — G4700 Insomnia, unspecified: Secondary | ICD-10-CM

## 2018-09-02 MED ORDER — SERTRALINE HCL 50 MG PO TABS: 50.0000 mg | ORAL_TABLET | Freq: Every day | ORAL | 0 refills | Status: DC

## 2018-09-02 MED ORDER — TRAZODONE HCL 50 MG PO TABS: 50.0000 mg | ORAL_TABLET | Freq: Every day | ORAL | 0 refills | Status: DC

## 2018-09-02 MED ORDER — NAPROXEN 500 MG PO TABS: 500.0000 mg | ORAL_TABLET | Freq: Two times a day (BID) | ORAL | 0 refills | Status: DC

## 2018-09-02 NOTE — Progress Notes (Signed)
  [facility] Adult Case Management Discharge Plan :  Will you be returning to the same living situation after discharge:  Yes,  home until he is able to get into an [facility].  At discharge, do you have transportation home?: Yes,  bus or family member. pt working on transportation Do you have the ability to pay for your medications: Yes,  mental health. medicaid potential  Release of information consent forms completed and submitted to medical records by CSW.   Patient to Follow up at: Follow-up Information    [facility] Follow up.   Specialty:  Behavioral Health Why:  Hospital follow-up on Tuesday, 10/22 at 8:00AM. Please bring: social security card, photo ID, and hospital discharge paperwork to this appt. Thank you.  Contact information: [address] [place] [address] [phone]        [facility].   Why:  Walk in within 3 days of hospital discharge to be assessed for Substance Abuse Intensive Outpatient Program/therapy. Walk in hours: Mondays, Wednesdays, and Fridays from 11:00AM-12:30PM. Thank you.  Contact information: [address], [place] [address] Phone: [phone] Fax: [phone]       [facility] Follow up.   Specialty:  Behavioral Health Why:  Social worker left messages for Charmian Muff ([facility] Depend[facility] Outpatient [facility]) to inquire about out of pocket coset of CDIOP. Please follow-up with her at discharge if you are interested in this provider/program. Her number is: [phone] Contact information: [address] [place] [address] [phone] mc [place] [address] [phone]          Next level of care provider has access to [facility] Link:no  Safety Planning and Suicide Prevention discussed: Yes,  SPE completed with pt; pt declined to consent to collateral contact. SPI pamphlet and mobile crisis information provided to pt.   Have you used any form of tobacco in the last 30  days? (Cigarettes, Smokeless Tobacco, Cigars, and/or Pipes): Yes  Has patient been referred to the Quitline?: Patient refused referral  Patient has been referred for addiction treatment: Yes  Rona Ravens, LCSW 09/02/2018, 9:24 AM

## 2018-09-02 NOTE — Discharge Summary (Signed)
Physician Discharge Summary Note  Patient:  Casey Mcguire is an 37 y.o., male MRN:  [id number] DOB:  12/30/1980 Patient phone:  [phone] (home)  Patient address:   [address] [place] [address],  Total Time spent with patient: 30 minutes  Date of Admission:  08/29/2018 Date of Discharge: 09/02/2018  Reason for Admission:  Depresssion, SI, opiate abuse  Principal Problem: Opioid use disorder, severe, dependence [facility]) Discharge Diagnoses: Patient Active Problem List   Diagnosis Date Noted  . Major depressive disorder, recurrent severe without psychotic features (HCC) [F33.2] 08/29/2018  . Opioid use disorder, severe, dependence (HCC) [F11.20] 08/29/2018  . Status epilepticus (HCC) [G40.901]   . Altered mental status [R41.82] 07/01/2015  . Acute respiratory failure with hypoxia (HCC) [J96.01] 07/01/2015  . Accidental heroin overdose (HCC) [T40.1X1A] 07/01/2015  . Encephalopathy acute [G93.40] 07/01/2015    Past Psychiatric History: see H&P  Past Medical History:  Past Medical History:  Diagnosis Date  . Depression   . Drug abuse (HCC)    History reviewed. No pertinent surgical history. Family History: History reviewed. No pertinent family history. Family Psychiatric  History: see H&P Social History:  Social History   Substance and Sexual Activity  Alcohol Use Not Currently  . Alcohol/week: 0.0 standard drinks     Social History   Substance and Sexual Activity  Drug Use Yes  . Types: Marijuana, Heroin   Comment: heroin    Social History   Socioeconomic History  . Marital status: Single    Spouse name: Not on file  . Number of children: Not on file  . Years of education: Not on file  . Highest education level: Not on file  Occupational History  . Not on file  Social Needs  . Financial resource strain: Not on file  . Food insecurity:    Worry: Not on file    Inability: Not on file  . Transportation needs:    Medical: Not on file     Non-medical: Not on file  Tobacco Use  . Smoking status: Current Every Day Smoker    Packs/day: 2.00    Types: Cigarettes  . Smokeless tobacco: Never Used  Substance and Sexual Activity  . Alcohol use: Not Currently    Alcohol/week: 0.0 standard drinks  . Drug use: Yes    Types: Marijuana, Heroin    Comment: heroin  . Sexual activity: Yes  Lifestyle  . Physical activity:    Days per week: Not on file    Minutes per session: Not on file  . Stress: Not on file  Relationships  . Social connections:    Talks on phone: Not on file    Gets together: Not on file    Attends religious service: Not on file    Active member of club or organization: Not on file    Attends meetings of clubs or organizations: Not on file    Relationship status: Not on file  Other Topics Concern  . Not on file  Social History Narrative  . Not on file    Hospital Course:    Jadie Comas is a 37 y/o M without formal psychiatric history who was admitted from [facility] voluntarily with worsening depression, SI with plan to overdose on heroin, and worsening use of heroin. Pt was medically cleared and then transferred to [facility] for additional treatment and stabilization. He was started on COWS with clonidine for opiate withdrawal, and he was started on trial of zoloft to help with depression  and anxiety. Pt had improvement of his presenting symptoms during his stay.  Today upon evaluation, pt shares, "I'm doing good." He denies any specific concerns. He is sleeping well. His appetite is good. He denies other physical complaints. He denies SI/HI/AH/VH. He is tolerating his medications well, and he is in agreement to continue his current regimen without changes. He plans to return to staying with family for a few days and then go to [facility] to stay in a sober living environment and work on his substance use recovery. He plans to follow up [facility] for mental health outpatient follow up and [facility] for substance use  treatment. He was able to engage in safety planning including plan to return to [facility] or contact emergency services if he feels unable to maintain his own safety or the safety of others. Pt had no further questions, comments, or concerns.   Physical Findings: AIMS: Facial and Oral Movements Muscles of Facial Expression: None, normal Lips and Perioral Area: None, normal Jaw: None, normal Tongue: None, normal,Extremity Movements Upper (arms, wrists, hands, fingers): None, normal Lower (legs, knees, ankles, toes): None, normal, Trunk Movements Neck, shoulders, hips: None, normal, Overall Severity Severity of abnormal movements (highest score from questions above): None, normal Incapacitation due to abnormal movements: None, normal Patient's awareness of abnormal movements (rate only patient's report): No Awareness, Dental Status Current problems with teeth and/or dentures?: No Does patient usually wear dentures?: No  CIWA:    COWS:  COWS Total Score: 8  Musculoskeletal: Strength & Muscle Tone: within normal limits Gait & Station: normal Patient leans: N/A  Psychiatric Specialty Exam: Physical Exam  Nursing note and vitals reviewed.   Review of Systems  Constitutional: Negative for chills and fever.  Respiratory: Negative for cough and shortness of breath.   Cardiovascular: Negative for chest pain.  Gastrointestinal: Negative for abdominal pain, heartburn, nausea and vomiting.  Psychiatric/Behavioral: Negative for depression, hallucinations and suicidal ideas. The patient is not nervous/anxious and does not have insomnia.     Blood pressure 115/83, pulse 84, temperature 97.7 F (36.5 C), resp. rate 18, height 5\' 9"  (1.753 m), weight 58.1 kg.Body mass index is 18.9 kg/m.  General Appearance: Casual and Fairly Groomed  Eye Contact:  Good  Speech:  Clear and Coherent and Normal Rate  Volume:  Normal  Mood:  Euthymic  Affect:  Appropriate and Congruent  Thought Process:  Coherent  and Goal Directed  Orientation:  Full (Time, Place, and Person)  Thought Content:  Logical  Suicidal Thoughts:  No  Homicidal Thoughts:  No  Memory:  Immediate;   Fair Recent;   Fair Remote;   Fair  Judgement:  Fair  Insight:  Fair  Psychomotor Activity:  Normal  Concentration:  Concentration: Fair  Recall:  [employer] of Knowledge:  Fair  Language:  Fair  Akathisia:  No  Handed:    AIMS (if indicated):     Assets:  Resilience Social Support  ADL's:  Intact  Cognition:  WNL  Sleep:  Number of Hours: 4.25     Have you used any form of tobacco in the last 30 days? (Cigarettes, Smokeless Tobacco, Cigars, and/or Pipes): Yes  Has this patient used any form of tobacco in the last 30 days? (Cigarettes, Smokeless Tobacco, Cigars, and/or Pipes) Yes, Yes, A prescription for an FDA-approved tobacco cessation medication was offered at discharge and the patient refused  Blood Alcohol level:  Lab Results  Component Value Date   [facility] <10 08/28/2018  ETH <5 07/01/2015    Metabolic Disorder Labs:  No results found for: HGBA1C, MPG No results found for: PROLACTIN Lab Results  Component Value Date   TRIG 77 07/01/2015    See Psychiatric Specialty Exam and Suicide Risk Assessment completed by Attending Physician prior to discharge.  Discharge destination:  Home  Is patient on multiple antipsychotic therapies at discharge:  No   Has Patient had three or more failed trials of antipsychotic monotherapy by history:  No  Recommended Plan for Multiple Antipsychotic Therapies: NA   Allergies as of 09/02/2018   No Known Allergies     Medication List    STOP taking these medications   acetaminophen 325 MG tablet Commonly known as:  TYLENOL   clindamycin 300 MG capsule Commonly known as:  CLEOCIN     TAKE these medications     Indication  naproxen 500 MG tablet Commonly known as:  NAPROSYN Take 1 tablet (500 mg total) by mouth 2 (two) times daily.  Indication:  Pain    sertraline 50 MG tablet Commonly known as:  ZOLOFT Take 1 tablet (50 mg total) by mouth daily. Start taking on:  09/03/2018  Indication:  Major Depressive Disorder   traZODone 50 MG tablet Commonly known as:  DESYREL Take 1 tablet (50 mg total) by mouth at bedtime.  Indication:  Trouble Sleeping      Follow-up Information    [facility] Follow up.   Specialty:  Behavioral Health Why:  Hospital follow-up on Tuesday, 10/22 at 8:00AM. Please bring: social security card, photo ID, and hospital discharge paperwork to this appt. Thank you.  Contact information: [address] [place] [address] [phone]        [facility] Follow up.   Why:  Walk in within 3 days of hospital discharge to be assessed for Substance Abuse Intensive Outpatient Program/therapy. Walk in hours: Mondays, Wednesdays, and Fridays from 11:00AM-12:30PM. Thank you.  Contact information: [address], [place] [address] Phone: [phone] Fax: [phone]       BEHAVIORAL HEALTH INTENSIVE PSYCH Follow up.   Specialty:  Behavioral Health Why:  Social worker left messages for Charmian Muff ([facility]) to inquire about out of pocket coset of CDIOP. Please follow-up with her at discharge if you are interested in this provider/program. Her number is: [phone] Contact information: [address] [place] [address] [id number] mc [place] [address] [phone]          Follow-up recommendations:  Activity:  as tolerated Diet:  normal Tests:  NA Other:  see above for DC plan  Comments:    Signed: Micheal Likens, MD 09/02/2018, 12:24 PM

## 2018-09-02 NOTE — Progress Notes (Signed)
CSW arranged interview with Winferd Humphrey ([facility] for patient this morning. This is now an option for pt if he chooses. Pt had also been encouraged to contact [facility] throughout the week and stated that he found an [facility] that would likely accept him after face to face interview. Pt will follow-up at [facility] for SAIOP and has [facility] appt for medication management. Pt also was encouraged to contact Charmian Muff (CDIOP through [facility]) to inquire about out of pocket cost for the program, as pt reports he has some money to possibly pay for this rather than [facility]. Pt provided with AA/NA information and [facility] pamphlet for additional community support. Pt continues to state that he wants to discuss discharge plan, despite plan being made and explained to him. Pt unable to verbalize what else he is wanting for discharge plan.   Malikah Principato S. Alan Ripper, MSW, LCSW Clinical Social Worker 09/02/2018 11:54 AM

## 2018-09-02 NOTE — Progress Notes (Signed)
Recreation Therapy Notes  Date: 10.18.19 Time: 0930 Location: [address] Dayroom  Group Topic: Stress Management  Goal Area(s) Addresses:  Patient will verbalize importance of using healthy stress management.  Patient will identify positive emotions associated with healthy stress management.   Behavioral Response: Engaged  Intervention: Stress Management  Activity : Progressive Muscle Relaxation.  LRT introduced the stress management technique of progressive muscle relaxation.  LRT read a script to guide patients in tensing each muscle group individually then relaxing them. Patients were to follow along as the script was read.  Education:  Stress Management, Discharge Planning.   Education Outcome: Acknowledges edcuation/In group clarification offered/Needs additional education  Clinical Observations/Feedback: Pt attended and participated in group.    Casey Mcguire, LRT/CTRS         Casey Mcguire, Casey Mcguire A 09/02/2018 11:09 AM

## 2018-09-02 NOTE — Plan of Care (Signed)
Discharge note Patient verbalizes readiness for discharge. Follow up plan explained, [facility]S, Transition record and SRA given. Prescriptions and teaching provided. Belongings returned and signed for. Suicide safety plan completed and signed. Patient verbalizes understanding. Patient denies SI/HI and assures this Clinical research associate he will seek assistance should that change. Patient discharged to lobby where grandfather in law was waiting.  Problem: Education: Goal: Knowledge of [place] [facility] Education information/materials will improve Outcome: Adequate for Discharge Goal: Emotional status will improve Outcome: Adequate for Discharge Goal: Mental status will improve Outcome: Adequate for Discharge Goal: Verbalization of understanding the information provided will improve Outcome: Adequate for Discharge   Problem: Activity: Goal: Interest or engagement in activities will improve Outcome: Adequate for Discharge Goal: Sleeping patterns will improve Outcome: Adequate for Discharge   Problem: Coping: Goal: Ability to verbalize frustrations and anger appropriately will improve Outcome: Adequate for Discharge Goal: Ability to demonstrate self-control will improve Outcome: Adequate for Discharge   Problem: Health Behavior/Discharge Planning: Goal: Identification of resources available to assist in meeting health care needs will improve Outcome: Adequate for Discharge Goal: Compliance with treatment plan for underlying cause of condition will improve Outcome: Adequate for Discharge   Problem: Physical Regulation: Goal: Ability to maintain clinical measurements within normal limits will improve Outcome: Adequate for Discharge   Problem: Safety: Goal: Periods of time without injury will increase Outcome: Adequate for Discharge   Problem: Education: Goal: Knowledge of [place] [facility] Education information/materials will improve Outcome: Adequate for Discharge Goal: Emotional status will  improve Outcome: Adequate for Discharge Goal: Mental status will improve Outcome: Adequate for Discharge Goal: Verbalization of understanding the information provided will improve Outcome: Adequate for Discharge   Problem: Activity: Goal: Interest or engagement in activities will improve Outcome: Adequate for Discharge Goal: Sleeping patterns will improve Outcome: Adequate for Discharge   Problem: Coping: Goal: Ability to verbalize frustrations and anger appropriately will improve Outcome: Adequate for Discharge Goal: Ability to demonstrate self-control will improve Outcome: Adequate for Discharge   Problem: Health Behavior/Discharge Planning: Goal: Identification of resources available to assist in meeting health care needs will improve Outcome: Adequate for Discharge Goal: Compliance with treatment plan for underlying cause of condition will improve Outcome: Adequate for Discharge   Problem: Physical Regulation: Goal: Ability to maintain clinical measurements within normal limits will improve Outcome: Adequate for Discharge   Problem: Safety: Goal: Periods of time without injury will increase Outcome: Adequate for Discharge   Problem: Education: Goal: Ability to make informed decisions regarding treatment will improve Outcome: Adequate for Discharge   Problem: Coping: Goal: Coping ability will improve Outcome: Adequate for Discharge   Problem: Health Behavior/Discharge Planning: Goal: Identification of resources available to assist in meeting health care needs will improve Outcome: Adequate for Discharge   Problem: Medication: Goal: Compliance with prescribed medication regimen will improve Outcome: Adequate for Discharge   Problem: Self-Concept: Goal: Ability to disclose and discuss suicidal ideas will improve Outcome: Adequate for Discharge Goal: Will verbalize positive feelings about self Outcome: Adequate for Discharge   Problem: Education: Goal:  Utilization of techniques to improve thought processes will improve Outcome: Adequate for Discharge Goal: Knowledge of the prescribed therapeutic regimen will improve Outcome: Adequate for Discharge   Problem: Activity: Goal: Interest or engagement in leisure activities will improve Outcome: Adequate for Discharge Goal: Imbalance in normal sleep/wake cycle will improve Outcome: Adequate for Discharge   Problem: Coping: Goal: Coping ability will improve Outcome: Adequate for Discharge Goal: Will verbalize feelings Outcome: Adequate for Discharge  Problem: Health Behavior/Discharge Planning: Goal: Ability to make decisions will improve Outcome: Adequate for Discharge Goal: Compliance with therapeutic regimen will improve Outcome: Adequate for Discharge   Problem: Role Relationship: Goal: Will demonstrate positive changes in social behaviors and relationships Outcome: Adequate for Discharge   Problem: Safety: Goal: Ability to disclose and discuss suicidal ideas will improve Outcome: Adequate for Discharge Goal: Ability to identify and utilize support systems that promote safety will improve Outcome: Adequate for Discharge   Problem: Self-Concept: Goal: Will verbalize positive feelings about self Outcome: Adequate for Discharge Goal: Level of anxiety will decrease Outcome: Adequate for Discharge   Problem: Education: Goal: Knowledge of disease or condition will improve Outcome: Adequate for Discharge Goal: Understanding of discharge needs will improve Outcome: Adequate for Discharge   Problem: Health Behavior/Discharge Planning: Goal: Ability to identify changes in lifestyle to reduce recurrence of condition will improve Outcome: Adequate for Discharge Goal: Identification of resources available to assist in meeting health care needs will improve Outcome: Adequate for Discharge   Problem: Physical Regulation: Goal: Complications related to the disease process,  condition or treatment will be avoided or minimized Outcome: Adequate for Discharge   Problem: Safety: Goal: Ability to remain free from injury will improve Outcome: Adequate for Discharge

## 2018-09-02 NOTE — BHH Suicide Risk Assessment (Signed)
[facility] Discharge Suicide Risk Assessment   Principal Problem: Opioid use disorder, severe, dependence [facility]) Discharge Diagnoses:  Patient Active Problem List   Diagnosis Date Noted  . Major depressive disorder, recurrent severe without psychotic features (HCC) [F33.2] 08/29/2018  . Opioid use disorder, severe, dependence (HCC) [F11.20] 08/29/2018  . Status epilepticus (HCC) [G40.901]   . Altered mental status [R41.82] 07/01/2015  . Acute respiratory failure with hypoxia (HCC) [J96.01] 07/01/2015  . Accidental heroin overdose (HCC) [T40.1X1A] 07/01/2015  . Encephalopathy acute [G93.40] 07/01/2015    Total Time spent with patient: 30 minutes  Musculoskeletal: Strength & Muscle Tone: within normal limits Gait & Station: normal Patient leans: N/A  Psychiatric Specialty Exam: Review of Systems  Constitutional: Negative for chills and fever.  Respiratory: Negative for cough and shortness of breath.   Cardiovascular: Negative for chest pain.  Gastrointestinal: Negative for abdominal pain, heartburn, nausea and vomiting.  Psychiatric/Behavioral: Negative for depression, hallucinations and suicidal ideas. The patient is not nervous/anxious and does not have insomnia.     Blood pressure 115/83, pulse 84, temperature 97.7 F (36.5 C), resp. rate 18, height 5\' 9"  (1.753 m), weight 58.1 kg.Body mass index is 18.9 kg/m.  General Appearance: Casual and Fairly Groomed  Patent attorney::  Good  Speech:  Clear and Coherent and Normal Rate  Volume:  Normal  Mood:  Euthymic  Affect:  Appropriate and Congruent  Thought Process:  Coherent and Goal Directed  Orientation:  Full (Time, Place, and Person)  Thought Content:  Logical  Suicidal Thoughts:  No  Homicidal Thoughts:  No  Memory:  Immediate;   Fair Recent;   Fair Remote;   Fair  Judgement:  Fair  Insight:  Lacking  Psychomotor Activity:  Normal  Concentration:  Good  Recall:  Good  Fund of Knowledge:Fair  Language: Fair  Akathisia:  No   Handed:    AIMS (if indicated):     Assets:  Resilience Social Support  Sleep:  Number of Hours: 4.25  Cognition: WNL  ADL's:  Intact   Mental Status Per Nursing Assessment::   On Admission:  Self-harm thoughts  Demographic Factors:  Male, Adolescent or young adult, Caucasian, Low socioeconomic status and Unemployed  Loss Factors: Financial problems/change in socioeconomic status  Historical Factors: Impulsivity  Risk Reduction Factors:   Positive social support, Positive therapeutic relationship and Positive coping skills or problem solving skills  Continued Clinical Symptoms:  Severe Anxiety and/or Agitation Depression:   Impulsivity Alcohol/Substance Abuse/Dependencies  Cognitive Features That Contribute To Risk:  None    Suicide Risk:  Minimal: No identifiable suicidal ideation.  Patients presenting with no risk factors but with morbid ruminations; may be classified as minimal risk based on the severity of the depressive symptoms  Follow-up Information    [facility] Follow up.   Specialty:  Behavioral Health Why:  Hospital follow-up on Tuesday, 10/22 at 8:00AM. Please bring: social security card, photo ID, and hospital discharge paperwork to this appt. Thank you.  Contact information: [address] [place] [address] [phone]        [facility] ([facility]) Follow up.   Why:  Walk in within 3 days of hospital discharge to be assessed for Substance Abuse Intensive Outpatient Program/therapy. Walk in hours: Mondays, Wednesdays, and Fridays from 11:00AM-12:30PM. Thank you.  Contact information: [address] [place], [place] [address] Phone: [phone] Fax: [phone]       BEHAVIORAL HEALTH INTENSIVE PSYCH Follow up.   Specialty:  Behavioral Health Why:  Social worker  left messages for Charmian Muff ([facility]) to inquire about out of pocket coset of CDIOP. Please follow-up with her at discharge if you are  interested in this provider/program. Her number is: [phone] Contact information: [address] [place] [id number] mc [place] [address] [phone]        Subjective Data:  Clebert Wenger is a 37 y/o M without formal psychiatric history who [facility] with worsening depression, SI with plan to overdose on heroin, and worsening use of heroin. Pt was medically cleared and then transferred to [facility] for additional treatment and stabilization. He was started on COWS with clonidine for opiate withdrawal, and he was started on trial of zoloft to help with depression and anxiety. Pt had improvement of his presenting symptoms during his stay.  Today upon evaluation, pt shares, "I'm doing good." He denies any specific concerns. He is sleeping well. His appetite is good. He denies other physical complaints. He denies SI/HI/AH/VH. He is tolerating his medications well, and he is in agreement to continue his current regimen without changes. He plans to return to staying with family for a few days and then go to [facility] to stay in a sober living environment and work on his substance use recovery. He plans to follow up [facility] for mental health outpatient follow up and [facility] for substance use treatment. He was able to engage in safety planning including plan to return to [facility] or contact emergency services if he feels unable to maintain his own safety or the safety of others. Pt had no further questions, comments, or concerns.   Plan Of Care/Follow-up recommendations:   - Discharge to outpatient level of care  Opioid detox.     - DC the Clonidine detox protocol  -Major Depressive Disorder, recurrent, severe, without psychosis     - Continue Sertraline 50 mg po daily.  Insomnia.     - Continue Trazodone 50 mg po qhs  Activity:  as tolerated Diet:  normal Tests:  NA Other:  see above for DC plan  Micheal Likens, MD 09/02/2018, 12:14  PM

## 2020-03-11 DIAGNOSIS — H5213 Myopia, bilateral: Secondary | ICD-10-CM | POA: Diagnosis not present

## 2020-03-13 DIAGNOSIS — H5213 Myopia, bilateral: Secondary | ICD-10-CM | POA: Diagnosis not present

## 2020-04-14 ENCOUNTER — Other Ambulatory Visit: Payer: Self-pay

## 2020-04-14 ENCOUNTER — Encounter (HOSPITAL_BASED_OUTPATIENT_CLINIC_OR_DEPARTMENT_OTHER): Payer: Self-pay | Admitting: *Deleted

## 2020-04-14 ENCOUNTER — Emergency Department (HOSPITAL_BASED_OUTPATIENT_CLINIC_OR_DEPARTMENT_OTHER)
Admission: EM | Admit: 2020-04-14 | Discharge: 2020-04-14 | Disposition: A | Discharge status: Home / Self Care | Payer: Medicaid Other | Attending: Emergency Medicine | Admitting: Emergency Medicine

## 2020-04-14 DIAGNOSIS — F17210 Nicotine dependence, cigarettes, uncomplicated: Secondary | ICD-10-CM | POA: Insufficient documentation

## 2020-04-14 DIAGNOSIS — F1111 Opioid abuse, in remission: Secondary | ICD-10-CM | POA: Insufficient documentation

## 2020-04-14 DIAGNOSIS — Z0289 Encounter for other administrative examinations: Secondary | ICD-10-CM | POA: Diagnosis present

## 2020-04-14 DIAGNOSIS — F1911 Other psychoactive substance abuse, in remission: Secondary | ICD-10-CM

## 2020-04-14 NOTE — ED Provider Notes (Signed)
[place] EMERGENCY DEPARTMENT Provider Note   CSN: [id number] Arrival date & time: 04/14/20  1758     History Chief Complaint  Patient presents with  . Addiction Problem    HAYS DUNNIGAN is a 39 y.o. male.  MEMPHIS DECOTEAU is a 39 y.o. male with a history of depression and heroin abuse, who presents to the ED requesting resources for rehab.  He reports that he last used heroin about 7-10 days ago, has gotten himself through withdrawals and is feeling much better now.  Reports he is struggled with heroin abuse for many years and wants to get clean.  He has support from his partner.  He states he recently got Medicaid, and just needs help with resources, he has not seen a primary care doctor in many years and is requesting referrals for rehab facilities.  Denies any thoughts of hurting himself or others.  Denies other substance abuse.  Denies any fevers, vomiting, chills, diarrhea, abdominal pain, chest pain, shortness of breath, denies any other medical complaints today.        Past Medical History:  Diagnosis Date  . Depression   . Drug abuse [facility])     Patient Active Problem List   Diagnosis Date Noted  . Major depressive disorder, recurrent severe without psychotic features ([place]) 08/29/2018  . Opioid use disorder, severe, dependence ([place]) 08/29/2018  . Status epilepticus ([place])   . Altered mental status 07/01/2015  . Acute respiratory failure with hypoxia ([place]) 07/01/2015  . Accidental heroin overdose ([place]) 07/01/2015  . Encephalopathy acute 07/01/2015    History reviewed. No pertinent surgical history.     History reviewed. No pertinent family history.  Social History   Tobacco Use  . Smoking status: Current Every Day Smoker    Packs/day: 2.00    Types: Cigarettes  . Smokeless tobacco: Never Used  Substance Use Topics  . Alcohol use: Not Currently    Alcohol/week: 0.0 standard drinks  . Drug use: Yes    Types: Marijuana, Heroin    Comment:  heroin    Home Medications Prior to Admission medications   Medication Sig Start Date End Date Taking? Authorizing Provider  naproxen (NAPROSYN) 500 MG tablet Take 1 tablet (500 mg total) by mouth 2 (two) times daily. 09/02/18   Pennelope Bracken, MD  sertraline (ZOLOFT) 50 MG tablet Take 1 tablet (50 mg total) by mouth daily. 09/03/18   Pennelope Bracken, MD  traZODone (DESYREL) 50 MG tablet Take 1 tablet (50 mg total) by mouth at bedtime. 09/02/18   Pennelope Bracken, MD    Allergies    Patient has no known allergies.  Review of Systems   Review of Systems  Constitutional: Negative for chills and fever.  Respiratory: Negative for cough and shortness of breath.   Cardiovascular: Negative for chest pain.  Gastrointestinal: Negative for abdominal pain, diarrhea, nausea and vomiting.  Musculoskeletal: Negative for arthralgias and myalgias.  Skin: Negative for color change and rash.  Neurological: Negative for dizziness, syncope and light-headedness.  All other systems reviewed and are negative.   Physical Exam Updated Vital Signs BP 129/74 (BP Location: Left Arm)   Pulse 71   Temp 98.4 F (36.9 C) (Oral)   Resp 18   Ht 5\' 9"  (1.753 m)   Wt 54.4 kg   SpO2 100%   BMI 17.72 kg/m   Physical Exam Vitals and nursing note reviewed.  Constitutional:      General: He is not in  acute distress.    Appearance: Normal appearance. He is well-developed and normal weight. He is not ill-appearing or diaphoretic.  HENT:     Head: Normocephalic and atraumatic.  Eyes:     General:        Right eye: No discharge.        Left eye: No discharge.  Cardiovascular:     Rate and Rhythm: Normal rate and regular rhythm.     Pulses: Normal pulses.     Heart sounds: Normal heart sounds. No murmur. No friction rub. No gallop.   Pulmonary:     Effort: Pulmonary effort is normal. No respiratory distress.     Breath sounds: Normal breath sounds.     Comments: Respirations  equal and unlabored, patient able to speak in full sentences, lungs clear to auscultation bilaterally Abdominal:     General: Abdomen is flat. Bowel sounds are normal. There is no distension.     Palpations: Abdomen is soft. There is no mass.     Tenderness: There is no abdominal tenderness. There is no guarding.     Comments: Abdomen soft, nondistended, nontender to palpation in all quadrants without guarding or peritoneal signs  Skin:    General: Skin is warm and dry.  Neurological:     Mental Status: He is alert and oriented to person, place, and time.     Coordination: Coordination normal.  Psychiatric:        Attention and Perception: He does not perceive auditory or visual hallucinations.        Mood and Affect: Mood normal.        Behavior: Behavior normal.        Thought Content: Thought content does not include homicidal or suicidal ideation.     ED Results / Procedures / Treatments   Labs (all labs ordered are listed, but only abnormal results are displayed) Labs Reviewed - No data to display  EKG None  Radiology No results found.  Procedures Procedures (including critical care time)  Medications Ordered in ED Medications - No data to display  ED Course  I have reviewed the triage vital signs and the nursing notes.  Pertinent labs & imaging results that were available during my care of the patient were reviewed by me and considered in my medical decision making (see chart for details).    MDM Rules/Calculators/A&P                      39 year old male presents requesting resources for substance abuse rehab.  He has struggled with heroin use for years and got himself clean and through detox over the past week.  He denies any medical complaints or symptoms today.  Is just requesting help with resources, he has not seen a primary care doctor in several years.  I have given him phone number and resources for PCP follow-up in packets with information for both  inpatient and outpatient rehab and counseling for substance abuse.  I put in a consult for peers support so they can contact him at home via phone to help him further with his recovery.  At this time he has no further complaints and is stable for discharge home.  Final Clinical Impression(s) / ED Diagnoses Final diagnoses:  Substance abuse in remission [facility])    Rx / DC Orders ED Discharge Orders    None       Legrand Rams 04/14/20 2002    Terald Sleeper, MD  04/14/20 2314  

## 2020-04-14 NOTE — ED Triage Notes (Signed)
Pt reports that he detox from heroin 10 days ago. Pt requesting help with referrals to rehab facilities. Denies SI, HI.

## 2020-04-14 NOTE — Discharge Instructions (Signed)
Use the resources provided for help with substance abuse, and called the phone number on your paperwork are on your Medicaid card to help get established with a primary care doctor.

## 2020-05-21 ENCOUNTER — Ambulatory Visit: Payer: Medicaid Other | Attending: Nurse Practitioner | Admitting: Nurse Practitioner

## 2020-05-21 ENCOUNTER — Other Ambulatory Visit: Payer: Self-pay

## 2020-05-21 ENCOUNTER — Encounter: Payer: Self-pay | Admitting: Nurse Practitioner

## 2020-05-21 VITALS — Ht 69.0 in | Wt 145.0 lb

## 2020-05-21 DIAGNOSIS — F1120 Opioid dependence, uncomplicated: Secondary | ICD-10-CM | POA: Diagnosis not present

## 2020-05-21 DIAGNOSIS — Z7689 Persons encountering health services in other specified circumstances: Secondary | ICD-10-CM | POA: Diagnosis not present

## 2020-05-21 DIAGNOSIS — D72829 Elevated white blood cell count, unspecified: Secondary | ICD-10-CM | POA: Diagnosis not present

## 2020-05-21 DIAGNOSIS — Z9189 Other specified personal risk factors, not elsewhere classified: Secondary | ICD-10-CM | POA: Diagnosis not present

## 2020-05-21 DIAGNOSIS — F332 Major depressive disorder, recurrent severe without psychotic features: Secondary | ICD-10-CM | POA: Diagnosis not present

## 2020-05-21 DIAGNOSIS — Z13220 Encounter for screening for lipoid disorders: Secondary | ICD-10-CM | POA: Diagnosis not present

## 2020-05-21 DIAGNOSIS — F17210 Nicotine dependence, cigarettes, uncomplicated: Secondary | ICD-10-CM | POA: Insufficient documentation

## 2020-05-21 DIAGNOSIS — R7309 Other abnormal glucose: Secondary | ICD-10-CM

## 2020-05-21 NOTE — Addendum Note (Signed)
Addended byMemory Dance on: 05/21/2020 02:40 PM   Modules accepted: Orders

## 2020-05-21 NOTE — Progress Notes (Signed)
Virtual Visit via Telephone Note Due to national recommendations of social distancing due to COVID 19, telehealth visit is felt to be most appropriate for this patient at this time.  I discussed the limitations, risks, security and privacy concerns of performing an evaluation and management service by telephone and the availability of in person appointments. I also discussed with the patient that there may be a patient responsible charge related to this service. The patient expressed understanding and agreed to proceed.    I connected with Casey Mcguire on 05/21/20  at   8:50 AM EDT  EDT by telephone and verified that I am speaking with the correct person using two identifiers.   Consent I discussed the limitations, risks, security and privacy concerns of performing an evaluation and management service by telephone and the availability of in person appointments. I also discussed with the patient that there may be a patient responsible charge related to this service. The patient expressed understanding and agreed to proceed.   Location of Patient: Private Residence    Location of Provider: [facility]    Persons participating in Telemedicine visit: Bertram Denver FNP-BC YY Bien CMA Casey Mcguire    History of Present Illness: Telemedicine visit for: Establish Care  He has a history of depression and heroin abuse. Continues to use illicit substances. He has no questions or concerns today. Declines SSRI which I do not recommend at this time due to score of PHQ9. Will continue to assess for rise in PHQ9 or increased mood lability. Denies chest pain, shortness of breath, palpitations, lightheadedness, dizziness, headaches or BLE edema.   Depression screen PHQ 2/9 05/21/2020  Decreased Interest 0  Down, Depressed, Hopeless 0  PHQ - 2 Score 0  Altered sleeping 1  Tired, decreased energy 1  Change in appetite 0  Feeling bad or failure about yourself  0   Trouble concentrating 1  Moving slowly or fidgety/restless 0  Suicidal thoughts 0  PHQ-9 Score 3   GAD 7 : Generalized Anxiety Score 05/21/2020  Nervous, Anxious, on Edge 1  Control/stop worrying 1  Worry too much - different things 0  Trouble relaxing 0  Restless 0  Easily annoyed or irritable 2  Afraid - awful might happen 0  Total GAD 7 Score 4       Past Medical History:  Diagnosis Date   Depression    Drug abuse (HCC)     Past Surgical History:  Procedure Laterality Date   APPENDECTOMY      Family History  Problem Relation Age of Onset   Heart disease Father     Social History   Socioeconomic History   Marital status: Single    Spouse name: Not on file   Number of children: Not on file   Years of education: Not on file   Highest education level: Not on file  Occupational History   Not on file  Tobacco Use   Smoking status: Current Every Day Smoker    Packs/day: 2.00    Types: Cigarettes   Smokeless tobacco: Never Used  Vaping Use   Vaping Use: Never used  Substance and Sexual Activity   Alcohol use: Not Currently    Alcohol/week: 0.0 standard drinks   Drug use: Not Currently    Types: Marijuana, Heroin    Comment: heroin   Sexual activity: Yes  Other Topics Concern   Not on file  Social History Narrative   Not on file  Social Determinants of Health   Financial Resource Strain:    Difficulty of Paying Living Expenses:   Food Insecurity:    Worried About Programme researcher, broadcasting/film/video in the Last Year:    Barista in the Last Year:   Transportation Needs:    Freight forwarder (Medical):    Lack of Transportation (Non-Medical):   Physical Activity:    Days of Exercise per Week:    Minutes of Exercise per Session:   Stress:    Feeling of Stress :   Social Connections:    Frequency of Communication with Friends and Family:    Frequency of Social Gatherings with Friends and Family:    Attends Religious  Services:    Active Member of Clubs or Organizations:    Attends Engineer, structural:    Marital Status:      Observations/Objective: Awake, alert and oriented x 3   Review of Systems  Constitutional: Negative for fever, malaise/fatigue and weight loss.  HENT: Negative.  Negative for nosebleeds.   Eyes: Negative.  Negative for blurred vision, double vision and photophobia.  Respiratory: Negative.  Negative for cough and shortness of breath.   Cardiovascular: Negative.  Negative for chest pain, palpitations and leg swelling.  Gastrointestinal: Negative.  Negative for heartburn, nausea and vomiting.  Musculoskeletal: Negative.  Negative for myalgias.  Neurological: Negative.  Negative for dizziness, focal weakness, seizures and headaches.  Psychiatric/Behavioral: Positive for depression and substance abuse. Negative for suicidal ideas. The patient is nervous/anxious.     Assessment and Plan: Casey Mcguire was seen today for establish care.  Diagnoses and all orders for this visit:  Encounter to establish care  Major depressive disorder, recurrent severe without psychotic features (HCC) Declines SSRI at this time   Opioid use disorder, severe, dependence (HCC) He has been given resources last monght for IP/OP rehab sources.     Follow Up Instructions Return for Physical ONLY no labs.     I discussed the assessment and treatment plan with the patient. The patient was provided an opportunity to ask questions and all were answered. The patient agreed with the plan and demonstrated an understanding of the instructions.   The patient was advised to call back or seek an in-person evaluation if the symptoms worsen or if the condition fails to improve as anticipated.  I provided 12 minutes of non-face-to-face time during this encounter including median intraservice time, reviewing previous notes, labs, imaging, medications and explaining diagnosis and management.  Claiborne Rigg, FNP-BC

## 2020-05-22 LAB — CBC
Hematocrit: 41.6 % (ref 37.5–51.0)
Hemoglobin: 13.9 g/dL (ref 13.0–17.7)
MCH: 30.4 pg (ref 26.6–33.0)
MCHC: 33.4 g/dL (ref 31.5–35.7)
MCV: 91 fL (ref 79–97)
Platelets: 210 10*3/uL (ref 150–450)
RBC: 4.57 x10E6/uL (ref 4.14–5.80)
RDW: 13.1 % (ref 11.6–15.4)
WBC: 9.5 10*3/uL (ref 3.4–10.8)

## 2020-05-22 LAB — LIPID PANEL
Chol/HDL Ratio: 3.5 ratio (ref 0.0–5.0)
Cholesterol, Total: 153 mg/dL (ref 100–199)
HDL: 44 mg/dL (ref >39)
LDL Chol Calc (NIH): 71 mg/dL (ref 0–99)
Triglycerides: 229 mg/dL — ABNORMAL HIGH (ref 0–149)
VLDL Cholesterol Cal: 38 mg/dL (ref 5–40)

## 2020-05-22 LAB — CMP14+EGFR
ALT: 10 IU/L (ref 0–44)
AST: 18 IU/L (ref 0–40)
Albumin/Globulin Ratio: 1.9 (ref 1.2–2.2)
Albumin: 4.3 g/dL (ref 4.0–5.0)
Alkaline Phosphatase: 60 IU/L (ref 48–121)
BUN/Creatinine Ratio: 9 (ref 9–20)
BUN: 7 mg/dL (ref 6–20)
Bilirubin Total: <0.2 mg/dL (ref 0.0–1.2)
CO2: 26 mmol/L (ref 20–29)
Calcium: 9.7 mg/dL (ref 8.7–10.2)
Chloride: 102 mmol/L (ref 96–106)
Creatinine, Ser: 0.76 mg/dL (ref 0.76–1.27)
GFR calc Af Amer: 136 mL/min/{1.73_m2} (ref >59)
GFR calc non Af Amer: 117 mL/min/{1.73_m2} (ref >59)
Globulin, Total: 2.3 g/dL (ref 1.5–4.5)
Glucose: 88 mg/dL (ref 65–99)
Potassium: 4.4 mmol/L (ref 3.5–5.2)
Sodium: 141 mmol/L (ref 134–144)
Total Protein: 6.6 g/dL (ref 6.0–8.5)

## 2020-05-22 LAB — HEMOGLOBIN A1C
Est. average glucose Bld gHb Est-mCnc: 111 mg/dL
Hgb A1c MFr Bld: 5.5 % (ref 4.8–5.6)

## 2020-06-17 ENCOUNTER — Other Ambulatory Visit: Payer: Self-pay

## 2020-06-17 ENCOUNTER — Encounter: Payer: Self-pay | Admitting: Nurse Practitioner

## 2020-06-17 ENCOUNTER — Ambulatory Visit: Payer: Medicaid Other | Attending: Nurse Practitioner | Admitting: Nurse Practitioner

## 2020-06-17 VITALS — BP 113/68 | HR 79 | Temp 97.7°F | Ht 70.0 in | Wt 132.0 lb

## 2020-06-17 DIAGNOSIS — G40901 Epilepsy, unspecified, not intractable, with status epilepticus: Secondary | ICD-10-CM

## 2020-06-17 DIAGNOSIS — Z0000 Encounter for general adult medical examination without abnormal findings: Secondary | ICD-10-CM

## 2020-06-17 DIAGNOSIS — Z114 Encounter for screening for human immunodeficiency virus [HIV]: Secondary | ICD-10-CM | POA: Diagnosis not present

## 2020-06-17 DIAGNOSIS — Z008 Encounter for other general examination: Secondary | ICD-10-CM | POA: Insufficient documentation

## 2020-06-17 DIAGNOSIS — Z1159 Encounter for screening for other viral diseases: Secondary | ICD-10-CM

## 2020-06-17 DIAGNOSIS — F1120 Opioid dependence, uncomplicated: Secondary | ICD-10-CM

## 2020-06-17 DIAGNOSIS — F332 Major depressive disorder, recurrent severe without psychotic features: Secondary | ICD-10-CM

## 2020-06-17 NOTE — Patient Instructions (Signed)
[facility] Address: [address], [place], [place] [address]  Open 24 hours Phone: [phone]

## 2020-06-17 NOTE — Progress Notes (Signed)
Assessment & Plan:  Casey Mcguire was seen today for annual exam.  Diagnoses and all orders for this visit:  Encounter for annual physical exam  Opioid dependence, uncomplicated (HCC)  Major depressive disorder, recurrent severe without psychotic features (HCC) No longer taking zoloft or trazodone.  Status epilepticus (HCC) RESOLVED Per hospital note 06-2015:  Presented 8/15 after being found with AMS and ?seizures after likely heroin use. He was seen in consultation by neurology with no further evidence seizure on EEG 8/15, no anticonvulsants started initially as seizure was felt to be provoked by heroin OD.     Patient has been counseled on age-appropriate routine health concerns for screening and prevention. These are reviewed and up-to-date. Referrals have been placed accordingly. Immunizations are up-to-date or declined.    Subjective:   Chief Complaint  Patient presents with   Annual Exam    Pt. is here for a physical.    HPI Casey Mcguire 39 y.o. male presents to office today for annual physical exam. He has a history of depression and substance abuse (heroin).  Feels like he had an episode where he had " a break from reality" several months ago. Associated symptoms at that time: Vomiting, loss of appetite, poor coordination.  States he was not abusing any drugs at that time. Currently no residual deficits.  Requesting referral to LCSW for resources to IP treatment centers for substance abuse. I have instructed him to also follow up with his insurance company for [employer].  Depression screen [facility] 2/9 06/17/2020 05/21/2020  Decreased Interest 1 0  Down, Depressed, Hopeless 1 0  PHQ - 2 Score 2 0  Altered sleeping 1 1  Tired, decreased energy 1 1  Change in appetite 1 0  Feeling bad or failure about yourself  1 0  Trouble concentrating 1 1  Moving slowly or fidgety/restless 0 0  Suicidal thoughts 0 0  PHQ-9 Score 7 3   GAD 7 : Generalized Anxiety Score 06/17/2020  05/21/2020  Nervous, Anxious, on Edge 1 1  Control/stop worrying 0 1  Worry too much - different things 1 0  Trouble relaxing 1 0  Restless 1 0  Easily annoyed or irritable 1 2  Afraid - awful might happen 1 0  Total GAD 7 Score 6 4    Review of Systems  Constitutional: Negative for fever, malaise/fatigue and weight loss.  HENT: Negative.  Negative for nosebleeds.   Eyes: Negative.  Negative for blurred vision, double vision and photophobia.  Respiratory: Negative.  Negative for cough and shortness of breath.   Cardiovascular: Negative.  Negative for chest pain, palpitations and leg swelling.  Gastrointestinal: Negative.  Negative for heartburn, nausea and vomiting.  Genitourinary: Negative.   Musculoskeletal: Negative.  Negative for myalgias.  Skin: Negative.   Neurological: Negative.  Negative for dizziness, focal weakness, seizures and headaches.  Endo/Heme/Allergies: Negative.   Psychiatric/Behavioral: Positive for depression and substance abuse. Negative for suicidal ideas. The patient is nervous/anxious.     Past Medical History:  Diagnosis Date   Depression    Drug abuse [facility])     Past Surgical History:  Procedure Laterality Date   APPENDECTOMY      Family History  Problem Relation Age of Onset   Heart disease Father     Social History Reviewed with no changes to be made today.   Outpatient Medications Prior to Visit  Medication Sig Dispense Refill   Buprenorphine HCl-Naloxone HCl (SUBOXONE) 8-2 MG FILM Place 1 each under  the tongue in the morning and at bedtime.     naproxen (NAPROSYN) 500 MG tablet Take 1 tablet (500 mg total) by mouth 2 (two) times daily. (Patient not taking: Reported on 05/21/2020) 30 tablet 0   sertraline (ZOLOFT) 50 MG tablet Take 1 tablet (50 mg total) by mouth daily. (Patient not taking: Reported on 05/21/2020) 30 tablet 0   traZODone (DESYREL) 50 MG tablet Take 1 tablet (50 mg total) by mouth at bedtime. (Patient not taking: Reported  on 05/21/2020) 30 tablet 0   No facility-administered medications prior to visit.    No Known Allergies     Objective:    BP 113/68 (BP Location: Left Arm, Patient Position: Sitting, Cuff Size: Normal)    Pulse 79    Temp 97.7 F (36.5 C) (Temporal)    Ht 5\' 10"  (1.778 m)    Wt 132 lb (59.9 kg)    SpO2 96%    BMI 18.94 kg/m  Wt Readings from Last 3 Encounters:  06/17/20 132 lb (59.9 kg)  05/21/20 145 lb (65.8 kg)  04/14/20 120 lb (54.4 kg)    Physical Exam Constitutional:      Appearance: He is well-developed.  HENT:     Head: Normocephalic and atraumatic.     Right Ear: Hearing, tympanic membrane, ear canal and external ear normal.     Left Ear: Hearing, tympanic membrane, ear canal and external ear normal.     Nose: Nose normal. No mucosal edema or rhinorrhea.     Mouth/Throat:     Pharynx: Uvula midline.     Tonsils: No tonsillar exudate. 1+ on the right. 1+ on the left.  Eyes:     General: Lids are normal. No scleral icterus.    Conjunctiva/sclera: Conjunctivae normal.     Pupils: Pupils are equal, round, and reactive to light.  Neck:     Thyroid: No thyromegaly.     Trachea: No tracheal deviation.  Cardiovascular:     Rate and Rhythm: Normal rate and regular rhythm.     Heart sounds: Normal heart sounds. No murmur heard.  No friction rub. No gallop.   Pulmonary:     Effort: Pulmonary effort is normal. No respiratory distress.     Breath sounds: Normal breath sounds. No wheezing or rales.  Chest:     Chest wall: No mass or tenderness.     Breasts:        Right: No inverted nipple, mass, nipple discharge, skin change or tenderness.        Left: No inverted nipple, mass, nipple discharge, skin change or tenderness.  Abdominal:     General: Bowel sounds are normal. There is no distension.     Palpations: Abdomen is soft. There is no mass.     Tenderness: There is no abdominal tenderness. There is no guarding or rebound.     Hernia: There is no hernia in the left  inguinal area.  Musculoskeletal:        General: No tenderness or deformity. Normal range of motion.     Cervical back: Normal range of motion and neck supple.  Lymphadenopathy:     Cervical: No cervical adenopathy.     Lower Body: No right inguinal adenopathy. No left inguinal adenopathy.  Skin:    General: Skin is warm and dry.     Capillary Refill: Capillary refill takes less than 2 seconds.     Findings: No erythema.  Neurological:     Mental Status: He  is alert and oriented to person, place, and time.     Cranial Nerves: No cranial nerve deficit.     Motor: No abnormal muscle tone.     Coordination: Coordination normal.     Deep Tendon Reflexes: Reflexes normal.  Psychiatric:        Attention and Perception: Attention and perception normal.        Mood and Affect: Mood and affect normal.        Speech: Speech normal.        Behavior: Behavior normal.        Thought Content: Thought content normal.        Cognition and Memory: Cognition and memory normal.        Judgment: Judgment normal.          Patient has been counseled extensively about nutrition and exercise as well as the importance of adherence with medications and regular follow-up. The patient was given clear instructions to go to ER or return to medical center if symptoms don't improve, worsen or new problems develop. The patient verbalized understanding.   Follow-up: No follow-ups on file.   Claiborne Rigg, FNP-BC [facility] [place], [place] [phone]   06/17/2020, 3:12 PM

## 2020-06-18 LAB — HEPATITIS C ANTIBODY: Hep C Virus Ab: <0.1 s/co ratio (ref 0.0–0.9)

## 2020-06-18 LAB — HIV ANTIBODY (ROUTINE TESTING W REFLEX): HIV Screen 4th Generation wRfx: NONREACTIVE

## 2020-06-20 ENCOUNTER — Encounter: Payer: Self-pay | Admitting: Nurse Practitioner

## 2020-11-12 DIAGNOSIS — K047 Periapical abscess without sinus: Secondary | ICD-10-CM | POA: Diagnosis not present

## 2021-10-16 DIAGNOSIS — Z419 Encounter for procedure for purposes other than remedying health state, unspecified: Secondary | ICD-10-CM | POA: Diagnosis not present

## 2021-11-16 DIAGNOSIS — Z419 Encounter for procedure for purposes other than remedying health state, unspecified: Secondary | ICD-10-CM | POA: Diagnosis not present

## 2021-12-17 DIAGNOSIS — Z419 Encounter for procedure for purposes other than remedying health state, unspecified: Secondary | ICD-10-CM | POA: Diagnosis not present

## 2022-01-14 DIAGNOSIS — Z419 Encounter for procedure for purposes other than remedying health state, unspecified: Secondary | ICD-10-CM | POA: Diagnosis not present

## 2022-02-14 DIAGNOSIS — Z419 Encounter for procedure for purposes other than remedying health state, unspecified: Secondary | ICD-10-CM | POA: Diagnosis not present

## 2022-03-16 DIAGNOSIS — Z419 Encounter for procedure for purposes other than remedying health state, unspecified: Secondary | ICD-10-CM | POA: Diagnosis not present

## 2022-04-16 DIAGNOSIS — Z419 Encounter for procedure for purposes other than remedying health state, unspecified: Secondary | ICD-10-CM | POA: Diagnosis not present

## 2022-05-16 DIAGNOSIS — Z419 Encounter for procedure for purposes other than remedying health state, unspecified: Secondary | ICD-10-CM | POA: Diagnosis not present

## 2022-06-16 DIAGNOSIS — Z419 Encounter for procedure for purposes other than remedying health state, unspecified: Secondary | ICD-10-CM | POA: Diagnosis not present

## 2022-07-17 DIAGNOSIS — Z419 Encounter for procedure for purposes other than remedying health state, unspecified: Secondary | ICD-10-CM | POA: Diagnosis not present

## 2022-08-16 DIAGNOSIS — Z419 Encounter for procedure for purposes other than remedying health state, unspecified: Secondary | ICD-10-CM | POA: Diagnosis not present

## 2022-09-16 DIAGNOSIS — Z419 Encounter for procedure for purposes other than remedying health state, unspecified: Secondary | ICD-10-CM | POA: Diagnosis not present

## 2022-10-16 DIAGNOSIS — Z419 Encounter for procedure for purposes other than remedying health state, unspecified: Secondary | ICD-10-CM | POA: Diagnosis not present

## 2022-10-21 DIAGNOSIS — J019 Acute sinusitis, unspecified: Secondary | ICD-10-CM | POA: Diagnosis not present

## 2022-10-21 DIAGNOSIS — B9689 Other specified bacterial agents as the cause of diseases classified elsewhere: Secondary | ICD-10-CM | POA: Diagnosis not present

## 2022-11-16 DIAGNOSIS — Z419 Encounter for procedure for purposes other than remedying health state, unspecified: Secondary | ICD-10-CM | POA: Diagnosis not present

## 2022-12-17 DIAGNOSIS — Z419 Encounter for procedure for purposes other than remedying health state, unspecified: Secondary | ICD-10-CM | POA: Diagnosis not present

## 2023-01-15 DIAGNOSIS — Z419 Encounter for procedure for purposes other than remedying health state, unspecified: Secondary | ICD-10-CM | POA: Diagnosis not present

## 2023-02-15 DIAGNOSIS — Z419 Encounter for procedure for purposes other than remedying health state, unspecified: Secondary | ICD-10-CM | POA: Diagnosis not present

## 2023-03-17 DIAGNOSIS — Z419 Encounter for procedure for purposes other than remedying health state, unspecified: Secondary | ICD-10-CM | POA: Diagnosis not present

## 2023-04-17 DIAGNOSIS — Z419 Encounter for procedure for purposes other than remedying health state, unspecified: Secondary | ICD-10-CM | POA: Diagnosis not present

## 2023-05-17 DIAGNOSIS — Z419 Encounter for procedure for purposes other than remedying health state, unspecified: Secondary | ICD-10-CM | POA: Diagnosis not present

## 2023-06-17 DIAGNOSIS — Z419 Encounter for procedure for purposes other than remedying health state, unspecified: Secondary | ICD-10-CM | POA: Diagnosis not present

## 2023-07-18 DIAGNOSIS — Z419 Encounter for procedure for purposes other than remedying health state, unspecified: Secondary | ICD-10-CM | POA: Diagnosis not present

## 2023-08-17 DIAGNOSIS — Z419 Encounter for procedure for purposes other than remedying health state, unspecified: Secondary | ICD-10-CM | POA: Diagnosis not present

## 2023-09-17 DIAGNOSIS — Z419 Encounter for procedure for purposes other than remedying health state, unspecified: Secondary | ICD-10-CM | POA: Diagnosis not present

## 2023-10-17 DIAGNOSIS — Z419 Encounter for procedure for purposes other than remedying health state, unspecified: Secondary | ICD-10-CM | POA: Diagnosis not present

## 2023-12-18 DIAGNOSIS — Z419 Encounter for procedure for purposes other than remedying health state, unspecified: Secondary | ICD-10-CM | POA: Diagnosis not present

## 2024-01-15 DIAGNOSIS — Z419 Encounter for procedure for purposes other than remedying health state, unspecified: Secondary | ICD-10-CM | POA: Diagnosis not present

## 2024-02-26 DIAGNOSIS — Z419 Encounter for procedure for purposes other than remedying health state, unspecified: Secondary | ICD-10-CM | POA: Diagnosis not present

## 2024-03-27 DIAGNOSIS — Z419 Encounter for procedure for purposes other than remedying health state, unspecified: Secondary | ICD-10-CM | POA: Diagnosis not present

## 2024-04-27 DIAGNOSIS — Z419 Encounter for procedure for purposes other than remedying health state, unspecified: Secondary | ICD-10-CM | POA: Diagnosis not present

## 2024-05-27 DIAGNOSIS — Z419 Encounter for procedure for purposes other than remedying health state, unspecified: Secondary | ICD-10-CM | POA: Diagnosis not present

## 2024-06-27 DIAGNOSIS — Z419 Encounter for procedure for purposes other than remedying health state, unspecified: Secondary | ICD-10-CM | POA: Diagnosis not present

## 2024-07-28 DIAGNOSIS — Z419 Encounter for procedure for purposes other than remedying health state, unspecified: Secondary | ICD-10-CM | POA: Diagnosis not present
